# Patient Record
Sex: Female | Born: 1985 | Race: White | Hispanic: No | Marital: Married | Smoking: Never smoker
Health system: Southern US, Community
[De-identification: ages and names within clinical notes are randomized; demographics above are authoritative.]

## PROBLEM LIST (undated history)

## (undated) DIAGNOSIS — J45909 Unspecified asthma, uncomplicated: Secondary | ICD-10-CM

## (undated) DIAGNOSIS — I499 Cardiac arrhythmia, unspecified: Secondary | ICD-10-CM

## (undated) DIAGNOSIS — J302 Other seasonal allergic rhinitis: Secondary | ICD-10-CM

## (undated) DIAGNOSIS — K589 Irritable bowel syndrome without diarrhea: Secondary | ICD-10-CM

## (undated) DIAGNOSIS — R519 Headache, unspecified: Secondary | ICD-10-CM

## (undated) DIAGNOSIS — E282 Polycystic ovarian syndrome: Secondary | ICD-10-CM

## (undated) DIAGNOSIS — K9041 Non-celiac gluten sensitivity: Secondary | ICD-10-CM

## (undated) DIAGNOSIS — K219 Gastro-esophageal reflux disease without esophagitis: Secondary | ICD-10-CM

## (undated) DIAGNOSIS — Z9889 Other specified postprocedural states: Secondary | ICD-10-CM

## (undated) DIAGNOSIS — J020 Streptococcal pharyngitis: Secondary | ICD-10-CM

## (undated) DIAGNOSIS — R112 Nausea with vomiting, unspecified: Secondary | ICD-10-CM

## (undated) DIAGNOSIS — R51 Headache: Secondary | ICD-10-CM

## (undated) HISTORY — PX: WISDOM TOOTH EXTRACTION: SHX21

## (undated) HISTORY — PX: MOUTH SURGERY: SHX715

## (undated) HISTORY — DX: Polycystic ovarian syndrome: E28.2

---

## 2014-09-16 DIAGNOSIS — C541 Malignant neoplasm of endometrium: Secondary | ICD-10-CM

## 2014-09-16 HISTORY — DX: Malignant neoplasm of endometrium: C54.1

## 2015-08-03 ENCOUNTER — Other Ambulatory Visit: Payer: Self-pay | Admitting: Obstetrics

## 2015-08-03 ENCOUNTER — Encounter (HOSPITAL_COMMUNITY): Payer: Self-pay

## 2015-08-13 ENCOUNTER — Encounter (HOSPITAL_COMMUNITY): Payer: Self-pay | Admitting: Anesthesiology

## 2015-08-13 MED ORDER — DOXYCYCLINE HYCLATE 100 MG IV SOLR
100.0000 mg | Freq: Two times a day (BID) | INTRAVENOUS | Status: DC
  Filled 2015-08-13 (×4): qty 100

## 2015-08-13 MED ORDER — DOXYCYCLINE HYCLATE 100 MG IV SOLR
100.0000 mg | INTRAVENOUS | Status: AC
  Administered 2015-08-14: 100 mg via INTRAVENOUS
  Filled 2015-08-13: qty 100

## 2015-08-13 NOTE — Anesthesia Preprocedure Evaluation (Addendum)
Anesthesia Evaluation  Patient identified by MRN, date of birth, ID band Patient awake    Reviewed: Allergy & Precautions, NPO status , Patient's Chart, lab work & pertinent test results  History of Anesthesia Complications (+) PONV and history of anesthetic complications  Airway Mallampati: I       Dental no notable dental hx.    Pulmonary asthma ,    Pulmonary exam normal        Cardiovascular negative cardio ROS Normal cardiovascular exam+ dysrhythmias      Neuro/Psych  Headaches, negative psych ROS   GI/Hepatic GERD  Medicated and Controlled,IBS Gluten sensitive enteropathy   Endo/Other  PCOS  Renal/GU negative Renal ROS  negative genitourinary   Musculoskeletal negative musculoskeletal ROS (+)   Abdominal (+) + obese,   Peds  Hematology   Anesthesia Other Findings   Reproductive/Obstetrics Endometrial polyp                            Anesthesia Physical Anesthesia Plan  ASA: II  Anesthesia Plan: General   Post-op Pain Management:    Induction: Intravenous  Airway Management Planned: LMA  Additional Equipment:   Intra-op Plan:   Post-operative Plan:   Informed Consent: I have reviewed the patients History and Physical, chart, labs and discussed the procedure including the risks, benefits and alternatives for the proposed anesthesia with the patient or authorized representative who has indicated his/her understanding and acceptance.     Plan Discussed with: CRNA and Surgeon  Anesthesia Plan Comments:        Anesthesia Quick Evaluation

## 2015-08-14 ENCOUNTER — Ambulatory Visit (HOSPITAL_COMMUNITY): Payer: BLUE CROSS/BLUE SHIELD | Admitting: Anesthesiology

## 2015-08-14 ENCOUNTER — Encounter (HOSPITAL_COMMUNITY)
Admission: RE | Disposition: A | Discharge status: Home / Self Care | Payer: Self-pay | Source: Ambulatory Visit | Attending: Obstetrics

## 2015-08-14 ENCOUNTER — Encounter (HOSPITAL_COMMUNITY): Payer: Self-pay

## 2015-08-14 ENCOUNTER — Ambulatory Visit (HOSPITAL_COMMUNITY)
Admission: RE | Admit: 2015-08-14 | Discharge: 2015-08-14 | Disposition: A | Discharge status: Home / Self Care | Payer: BLUE CROSS/BLUE SHIELD | Source: Ambulatory Visit | Attending: Obstetrics | Admitting: Obstetrics

## 2015-08-14 DIAGNOSIS — I499 Cardiac arrhythmia, unspecified: Secondary | ICD-10-CM | POA: Insufficient documentation

## 2015-08-14 DIAGNOSIS — J45909 Unspecified asthma, uncomplicated: Secondary | ICD-10-CM | POA: Insufficient documentation

## 2015-08-14 DIAGNOSIS — K219 Gastro-esophageal reflux disease without esophagitis: Secondary | ICD-10-CM | POA: Insufficient documentation

## 2015-08-14 DIAGNOSIS — K589 Irritable bowel syndrome without diarrhea: Secondary | ICD-10-CM | POA: Insufficient documentation

## 2015-08-14 DIAGNOSIS — Z79899 Other long term (current) drug therapy: Secondary | ICD-10-CM | POA: Diagnosis not present

## 2015-08-14 DIAGNOSIS — N840 Polyp of corpus uteri: Secondary | ICD-10-CM | POA: Diagnosis present

## 2015-08-14 DIAGNOSIS — N8502 Endometrial intraepithelial neoplasia [EIN]: Secondary | ICD-10-CM | POA: Insufficient documentation

## 2015-08-14 HISTORY — PX: DILATATION & CURETTAGE/HYSTEROSCOPY WITH MYOSURE: SHX6511

## 2015-08-14 HISTORY — DX: Other seasonal allergic rhinitis: J30.2

## 2015-08-14 HISTORY — DX: Unspecified asthma, uncomplicated: J45.909

## 2015-08-14 HISTORY — DX: Non-celiac gluten sensitivity: K90.41

## 2015-08-14 HISTORY — DX: Headache, unspecified: R51.9

## 2015-08-14 HISTORY — DX: Irritable bowel syndrome, unspecified: K58.9

## 2015-08-14 HISTORY — DX: Polycystic ovarian syndrome: E28.2

## 2015-08-14 HISTORY — DX: Cardiac arrhythmia, unspecified: I49.9

## 2015-08-14 HISTORY — DX: Headache: R51

## 2015-08-14 HISTORY — DX: Other specified postprocedural states: Z98.890

## 2015-08-14 HISTORY — DX: Gastro-esophageal reflux disease without esophagitis: K21.9

## 2015-08-14 HISTORY — DX: Nausea with vomiting, unspecified: R11.2

## 2015-08-14 HISTORY — DX: Streptococcal pharyngitis: J02.0

## 2015-08-14 LAB — CBC
HEMATOCRIT: 43.1 % (ref 36.0–46.0)
Hemoglobin: 14.7 g/dL (ref 12.0–15.0)
MCH: 32.2 pg (ref 26.0–34.0)
MCHC: 34.1 g/dL (ref 30.0–36.0)
MCV: 94.5 fL (ref 78.0–100.0)
PLATELETS: 380 10*3/uL (ref 150–400)
RBC: 4.56 MIL/uL (ref 3.87–5.11)
RDW: 12.3 % (ref 11.5–15.5)
WBC: 8.2 10*3/uL (ref 4.0–10.5)

## 2015-08-14 LAB — PREGNANCY, URINE: Preg Test, Ur: NEGATIVE

## 2015-08-14 LAB — TYPE AND SCREEN
ABO/RH(D): A POS
Antibody Screen: NEGATIVE

## 2015-08-14 LAB — ABO/RH: ABO/RH(D): A POS

## 2015-08-14 SURGERY — DILATATION & CURETTAGE/HYSTEROSCOPY WITH MYOSURE
Anesthesia: General | Site: Vagina

## 2015-08-14 MED ORDER — PROPOFOL 10 MG/ML IV BOLUS
INTRAVENOUS | Status: AC
  Filled 2015-08-14: qty 20

## 2015-08-14 MED ORDER — IBUPROFEN 600 MG PO TABS: 600.0000 mg | ORAL_TABLET | Freq: Four times a day (QID) | ORAL | Status: DC | PRN

## 2015-08-14 MED ORDER — MEPERIDINE HCL 25 MG/ML IJ SOLN: 6.2500 mg | INTRAMUSCULAR | Status: DC | PRN

## 2015-08-14 MED ORDER — FENTANYL CITRATE (PF) 100 MCG/2ML IJ SOLN
INTRAMUSCULAR | Status: DC | PRN
  Administered 2015-08-14 (×4): 50 ug via INTRAVENOUS

## 2015-08-14 MED ORDER — ONDANSETRON HCL 4 MG/2ML IJ SOLN
INTRAMUSCULAR | Status: AC
  Filled 2015-08-14: qty 2

## 2015-08-14 MED ORDER — CHLOROPROCAINE HCL 1 % IJ SOLN
INTRAMUSCULAR | Status: AC
Start: 2015-08-14 — End: 2015-08-14
  Filled 2015-08-14: qty 30

## 2015-08-14 MED ORDER — MIDAZOLAM HCL 2 MG/2ML IJ SOLN
INTRAMUSCULAR | Status: AC
  Filled 2015-08-14: qty 2

## 2015-08-14 MED ORDER — PROPOFOL 10 MG/ML IV BOLUS
INTRAVENOUS | Status: DC | PRN
  Administered 2015-08-14: 200 mg via INTRAVENOUS

## 2015-08-14 MED ORDER — DEXAMETHASONE SODIUM PHOSPHATE 10 MG/ML IJ SOLN
INTRAMUSCULAR | Status: AC
  Filled 2015-08-14: qty 1

## 2015-08-14 MED ORDER — LIDOCAINE HCL (CARDIAC) 20 MG/ML IV SOLN
INTRAVENOUS | Status: AC
  Filled 2015-08-14: qty 5

## 2015-08-14 MED ORDER — BUPIVACAINE HCL 0.5 % IJ SOLN
INTRAMUSCULAR | Status: DC | PRN
  Administered 2015-08-14: 30 mL

## 2015-08-14 MED ORDER — ONDANSETRON HCL 4 MG/2ML IJ SOLN
INTRAMUSCULAR | Status: DC | PRN
  Administered 2015-08-14: 4 mg via INTRAVENOUS

## 2015-08-14 MED ORDER — SCOPOLAMINE 1 MG/3DAYS TD PT72
MEDICATED_PATCH | TRANSDERMAL | Status: AC
  Filled 2015-08-14: qty 1

## 2015-08-14 MED ORDER — SODIUM CHLORIDE 0.9 % IR SOLN
Status: DC | PRN
  Administered 2015-08-14: 3000 mL

## 2015-08-14 MED ORDER — OXYCODONE-ACETAMINOPHEN 5-325 MG PO TABS: 2.0000 | ORAL_TABLET | Freq: Four times a day (QID) | ORAL | Status: DC | PRN

## 2015-08-14 MED ORDER — HYDROCODONE-ACETAMINOPHEN 7.5-325 MG PO TABS: 1.0000 | ORAL_TABLET | Freq: Once | ORAL | Status: DC | PRN

## 2015-08-14 MED ORDER — LIDOCAINE HCL (CARDIAC) 20 MG/ML IV SOLN
INTRAVENOUS | Status: DC | PRN
  Administered 2015-08-14: 50 mg via INTRAVENOUS

## 2015-08-14 MED ORDER — FENTANYL CITRATE (PF) 100 MCG/2ML IJ SOLN
INTRAMUSCULAR | Status: AC
  Filled 2015-08-14: qty 2

## 2015-08-14 MED ORDER — BUPIVACAINE HCL (PF) 0.5 % IJ SOLN
INTRAMUSCULAR | Status: AC
  Filled 2015-08-14: qty 30

## 2015-08-14 MED ORDER — MIDAZOLAM HCL 5 MG/5ML IJ SOLN
INTRAMUSCULAR | Status: DC | PRN
  Administered 2015-08-14: 2 mg via INTRAVENOUS

## 2015-08-14 MED ORDER — FENTANYL CITRATE (PF) 100 MCG/2ML IJ SOLN
25.0000 ug | INTRAMUSCULAR | Status: DC | PRN
  Administered 2015-08-14: 25 ug via INTRAVENOUS

## 2015-08-14 MED ORDER — KETOROLAC TROMETHAMINE 30 MG/ML IJ SOLN
INTRAMUSCULAR | Status: DC | PRN
  Administered 2015-08-14: 30 mg via INTRAVENOUS

## 2015-08-14 MED ORDER — DEXAMETHASONE SODIUM PHOSPHATE 4 MG/ML IJ SOLN
INTRAMUSCULAR | Status: DC | PRN
  Administered 2015-08-14: 4 mg via INTRAVENOUS

## 2015-08-14 MED ORDER — SCOPOLAMINE 1 MG/3DAYS TD PT72
1.0000 | MEDICATED_PATCH | Freq: Once | TRANSDERMAL | Status: DC
  Administered 2015-08-14: 1.5 mg via TRANSDERMAL

## 2015-08-14 MED ORDER — METOCLOPRAMIDE HCL 5 MG/ML IJ SOLN: 10.0000 mg | Freq: Once | INTRAMUSCULAR | Status: DC | PRN

## 2015-08-14 MED ORDER — KETOROLAC TROMETHAMINE 30 MG/ML IJ SOLN
INTRAMUSCULAR | Status: AC
  Filled 2015-08-14: qty 1

## 2015-08-14 MED ORDER — LACTATED RINGERS IV SOLN
INTRAVENOUS | Status: DC
  Administered 2015-08-14 (×2): via INTRAVENOUS

## 2015-08-14 MED ORDER — DEXAMETHASONE SODIUM PHOSPHATE 4 MG/ML IJ SOLN
INTRAMUSCULAR | Status: AC
  Filled 2015-08-14: qty 1

## 2015-08-14 MED ORDER — VASOPRESSIN 20 UNIT/ML IV SOLN
INTRAVENOUS | Status: AC
  Filled 2015-08-14: qty 1

## 2015-08-14 SURGICAL SUPPLY — 18 items
CATH ROBINSON RED A/P 16FR (CATHETERS) ×2 IMPLANT
CLOTH BEACON ORANGE TIMEOUT ST (SAFETY) ×2 IMPLANT
CONTAINER PREFILL 10% NBF 60ML (FORM) ×4 IMPLANT
DEVICE MYOSURE LITE (MISCELLANEOUS) ×2 IMPLANT
DEVICE MYOSURE REACH (MISCELLANEOUS) IMPLANT
FILTER ARTHROSCOPY CONVERTOR (FILTER) ×2 IMPLANT
GLOVE BIO SURGEON STRL SZ 6.5 (GLOVE) ×4 IMPLANT
GLOVE BIOGEL PI IND STRL 7.0 (GLOVE) ×2 IMPLANT
GLOVE BIOGEL PI INDICATOR 7.0 (GLOVE) ×2
GOWN STRL REUS W/TWL LRG LVL3 (GOWN DISPOSABLE) ×4 IMPLANT
PACK VAGINAL MINOR WOMEN LF (CUSTOM PROCEDURE TRAY) ×2 IMPLANT
PAD OB MATERNITY 4.3X12.25 (PERSONAL CARE ITEMS) ×2 IMPLANT
SEAL ROD LENS SCOPE MYOSURE (ABLATOR) ×2 IMPLANT
STENT BALLN UTERINE 4CM 6FR (STENTS) IMPLANT
TOWEL OR 17X24 6PK STRL BLUE (TOWEL DISPOSABLE) ×4 IMPLANT
TUBING AQUILEX INFLOW (TUBING) ×4 IMPLANT
TUBING AQUILEX OUTFLOW (TUBING) ×2 IMPLANT
WATER STERILE IRR 1000ML POUR (IV SOLUTION) ×2 IMPLANT

## 2015-08-14 NOTE — Op Note (Signed)
08/14/2015  12:55 PM  PATIENT:  Sherry [name]  29 y.o. female  PRE-OPERATIVE DIAGNOSIS:  Endometrial Polyp, complex hyperplasia, menorrhagia  [name]-OPERATIVE DIAGNOSIS:  Endometrial Polyp,  complex hyperplasia, menorrhagia  PROCEDURE:  Procedure(s): DILATATION & CURETTAGE/HYSTEROSCOPY WITH MYOSURE/Polypectomy (N/A)  SURGEON:  Surgeon(s) and Role:    * Aloha Gell, MD - Primary  PHYSICIAN ASSISTANT:   ASSISTANTS: none   ANESTHESIA:   local and general  EBL:  Total I/O In: 1000 [I.V.:1000] Out: 100 [Urine:100]  BLOOD ADMINISTERED:none  DRAINS: none   LOCAL MEDICATIONS USED:  MARCAINE    and Amount: 30 ml, 0.5 cc  SPECIMEN:  Source of Specimen:  endometrial currettings  DISPOSITION OF SPECIMEN:  PATHOLOGY  COUNTS:  YES  TOURNIQUET:  * No tourniquets in log *  DICTATION: .Note written in EPIC  PLAN OF CARE: Discharge to home after PACU  PATIENT DISPOSITION:  PACU - hemodynamically stable.   Delay start of Pharmacological VTE agent (>24hrs) due to surgical blood loss or risk of bleeding: yes   Abx: 100mg  IV doxycycline  Findings: non- visualization of bilateral ostia, globally thickened and vascular endometrium, single discreet polyp. hemostasis [name]-procedure  Indications: complex endometrial hyperplasia, menorrhagia, thickened endometrial stripe, endometrial polyp   After informed consent including discussion of risks of bleeding, infection, perforation,  the patient was taken to the operating room where general anesthesia was initiated without difficulty. She was prepped and draped in normal sterile fashion in the dorsal supine lithotomy position.  A bimanual examination was done to assess the size and position of the uterus. A speculum was placed in the vagina and single tooth tenaculum used to grasp the anterior lip of the cervix. Local anaesthetic was injected at 5 and 7 o'clock in the cervico-paracervical junction.   The cervix was then serially dilated  to a #19 Pratt dilator. The Myosure device was placed into the cervix and past the internal os.  Findins as above. Due to the globally thickened endometrium, neither ostia was seen. Attempt to increase pressure of hysteroscopic fluid was unsuccessful in visualization of the ostia. Myosure device was employed for suction and morcellation to remove the single polyp and the thicker polypoid tissue. Limited morcellation was done in the cornual region due to impaired visualization. A sharp curretage was hen carried out.  Hemostasis was noted. Instruments were removed.   The hysteroscope was then removed. Tenaculum was removed. The tenaculum site was hemostatic and the case was terminated. The patient tolerated the procedure well. Sponge, lap and needle counts were correct and the patient was taken to the recovery room in stable condition.   [name] A. 08/14/2015 12:57 PM

## 2015-08-14 NOTE — H&P (Signed)
CC;D&C, hysteroscopy, polypectomy  HPI: 29 yo G0, NP pt with h/o PCOS and alternating oligomenorrhea with menorrhagia who was found to have complex hyperplasia in a polyp on ebx 10.16. SIS showed thick endometrium and several polyps. Nl pap 5/16.  PMH: PCOS All: NKDA Meds: Prilosec, Claritin  Past Medical History  Diagnosis Date  . Asthma   . Strep throat   . Headache     Migraines  . Dysrhythmia     tachycardia  . Seasonal allergies   . PONV (postoperative nausea and vomiting)   . GERD (gastroesophageal reflux disease)   . IBS (irritable bowel syndrome)   . Gluten-sensitive enteropathy   . PCOS (polycystic ovarian syndrome)     Past Surgical History  Procedure Laterality Date  . Wisdom tooth extraction    . Mouth surgery      PE: Filed Vitals:   08/14/15 0855  BP: 129/93  Pulse: 89  Temp: 97.9 F (36.6 C)  Resp: 20   Gen: well appearing, no distress Abd: soft, NT, obese Gu: def to OR LE: NT, no edema  CBC    Component Value Date/Time   WBC 8.2 08/14/2015 0850   RBC 4.56 08/14/2015 0850   HGB 14.7 08/14/2015 0850   HCT 43.1 08/14/2015 0850   PLT 380 08/14/2015 0850   MCV 94.5 08/14/2015 0850   MCH 32.2 08/14/2015 0850   MCHC 34.1 08/14/2015 0850   RDW 12.3 08/14/2015 0850    A/P: complex hyperplasia, endometrial polyps  Plan D&C, hysteroscopy, polypectomy. R/B d/w pt.   Sherry Bowdoin A. 08/14/2015 11:36 AM

## 2015-08-14 NOTE — Transfer of Care (Signed)
Immediate Anesthesia Transfer of Care Note  Patient: Sherry [name]  Procedure(s) Performed: Procedure(s): DILATATION & CURETTAGE/HYSTEROSCOPY WITH MYOSURE/Polypectomy (N/A)  Patient Location: PACU  Anesthesia Type:General  Level of Consciousness: awake, alert  and oriented  Airway & Oxygen Therapy: Patient Spontanous Breathing and Patient connected to nasal cannula oxygen  [name]-op Assessment: Report given to RN  [name] vital signs: Reviewed  Last Vitals:  Filed Vitals:   08/14/15 0855  BP: 129/93  Pulse: 89  Temp: 36.6 C  Resp: 20    Complications: No apparent anesthesia complications

## 2015-08-14 NOTE — Brief Op Note (Signed)
08/14/2015  12:55 PM  PATIENT:  Sherry [name]  29 y.o. female  PRE-OPERATIVE DIAGNOSIS:  Endometrial Polyp, complex hyperplasia, menorrhagia  [name]-OPERATIVE DIAGNOSIS:  Endometrial Polyp,  complex hyperplasia, menorrhagia  PROCEDURE:  Procedure(s): DILATATION & CURETTAGE/HYSTEROSCOPY WITH MYOSURE/Polypectomy (N/A)  SURGEON:  Surgeon(s) and Role:    * Aloha Gell, MD - Primary  PHYSICIAN ASSISTANT:   ASSISTANTS: none   ANESTHESIA:   local and general  EBL:  Total I/O In: 1000 [I.V.:1000] Out: 100 [Urine:100]  BLOOD ADMINISTERED:none  DRAINS: none   LOCAL MEDICATIONS USED:  MARCAINE    and Amount: 30 ml, 0.5 cc  SPECIMEN:  Source of Specimen:  endometrial currettings  DISPOSITION OF SPECIMEN:  PATHOLOGY  COUNTS:  YES  TOURNIQUET:  * No tourniquets in log *  DICTATION: .Note written in EPIC  PLAN OF CARE: Discharge to home after PACU  PATIENT DISPOSITION:  PACU - hemodynamically stable.   Delay start of Pharmacological VTE agent (>24hrs) due to surgical blood loss or risk of bleeding: yes

## 2015-08-14 NOTE — Anesthesia Procedure Notes (Signed)
Procedure Name: LMA Insertion Date/Time: 08/14/2015 11:45 AM Performed by: Talbot Grumbling Pre-anesthesia Checklist: Patient identified, Emergency Drugs available, Suction available and Patient being monitored Patient Re-evaluated:Patient Re-evaluated prior to inductionOxygen Delivery Method: Circle system utilized Preoxygenation: Pre-oxygenation with 100% oxygen Intubation Type: IV induction Ventilation: Mask ventilation without difficulty LMA: LMA inserted LMA Size: 4.0 Number of attempts: 1 Placement Confirmation: positive ETCO2 and breath sounds checked- equal and bilateral Tube secured with: Tape Dental Injury: Teeth and Oropharynx as per pre-operative assessment

## 2015-08-14 NOTE — Anesthesia Postprocedure Evaluation (Signed)
Anesthesia [name] Note  Patient: Sherry [name]  Procedure(s) Performed: Procedure(s) (LRB): DILATATION & CURETTAGE/HYSTEROSCOPY WITH MYOSURE/Polypectomy (N/A)  Patient location during evaluation: PACU Anesthesia Type: General Level of consciousness: awake and alert and oriented Pain management: pain level controlled Vital Signs Assessment: [name]-procedure vital signs reviewed and stable Respiratory status: spontaneous breathing, nonlabored ventilation, respiratory function stable and patient connected to nasal cannula oxygen Cardiovascular status: blood pressure returned to baseline and stable Postop Assessment: No signs of nausea or vomiting Anesthetic complications: no    Last Vitals:  Filed Vitals:   08/14/15 1300 08/14/15 1315  BP: 127/87 122/88  Pulse: 89 88  Temp:    Resp: 21 15    Last Pain:  Filed Vitals:   08/14/15 1319  PainSc: 0-No pain                 Gaylynn Seiple A.

## 2015-08-14 NOTE — Discharge Instructions (Signed)

## 2015-08-15 ENCOUNTER — Encounter (HOSPITAL_COMMUNITY): Payer: Self-pay | Admitting: Obstetrics

## 2015-09-17 DIAGNOSIS — J020 Streptococcal pharyngitis: Secondary | ICD-10-CM

## 2015-09-17 HISTORY — DX: Streptococcal pharyngitis: J02.0

## 2015-10-23 ENCOUNTER — Encounter: Payer: Self-pay | Admitting: Gynecologic Oncology

## 2015-10-23 ENCOUNTER — Ambulatory Visit: Payer: BLUE CROSS/BLUE SHIELD | Attending: Gynecologic Oncology | Admitting: Gynecologic Oncology

## 2015-10-23 VITALS — BP 136/88 | HR 85 | Temp 97.8°F | Resp 18 | Ht 62.0 in | Wt 177.9 lb

## 2015-10-23 DIAGNOSIS — K219 Gastro-esophageal reflux disease without esophagitis: Secondary | ICD-10-CM | POA: Diagnosis not present

## 2015-10-23 DIAGNOSIS — J45909 Unspecified asthma, uncomplicated: Secondary | ICD-10-CM | POA: Insufficient documentation

## 2015-10-23 DIAGNOSIS — K589 Irritable bowel syndrome without diarrhea: Secondary | ICD-10-CM | POA: Insufficient documentation

## 2015-10-23 DIAGNOSIS — E282 Polycystic ovarian syndrome: Secondary | ICD-10-CM | POA: Insufficient documentation

## 2015-10-23 DIAGNOSIS — N970 Female infertility associated with anovulation: Secondary | ICD-10-CM | POA: Insufficient documentation

## 2015-10-23 DIAGNOSIS — Z975 Presence of (intrauterine) contraceptive device: Secondary | ICD-10-CM | POA: Diagnosis not present

## 2015-10-23 DIAGNOSIS — C541 Malignant neoplasm of endometrium: Secondary | ICD-10-CM | POA: Diagnosis not present

## 2015-10-23 DIAGNOSIS — Z79899 Other long term (current) drug therapy: Secondary | ICD-10-CM | POA: Diagnosis not present

## 2015-10-23 NOTE — Progress Notes (Signed)
Follow-up Note: Gyn-Onc  Consult was initially requested by Dr. Valentino Saxon for the evaluation of Sherry [name] 30 y.o. female  CC:  Chief Complaint  Patient presents with  . endometrial cancer    follow up from [facility]     Assessment/Plan:  Ms. Sherry [name]  is a 30 y.o.  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 S/p IUD placement in December 2016.  Will follow-up biopsy result today.  Will repeat biopsy at 3 months, and again in 6 months, but if not regressed by that point, continued medical therapy likely to be futile and would recommend consideration for definitive hysterectomy.   HPI: Sherry [name] is a 30 year old G0 who was seen by my partner, Dr Andrew Au, in consultation at the request of Dr Valentino Saxon in December, 2016 for grade 1 endometrial cancer on biopsy.  She has a history of primary infertility and anovulation, but denies heavy vaginal bleeding.  She desires future fertility.  Interval History: In December 2016 she underwent placement of progestin releasing IUD. Since placement she has had light vaginal bleeding daily.  Current Meds:  Outpatient Encounter Prescriptions as of 10/23/2015  Medication Sig  . ibuprofen (ADVIL,MOTRIN) 600 MG tablet Take 1 tablet (600 mg total) by mouth every 6 (six) hours as needed.  . valACYclovir (VALTREX) 500 MG tablet Take 500 mg by mouth 2 (two) times daily.  Marland Kitchen loratadine (CLARITIN) 10 MG tablet Take 10 mg by mouth daily. Reported on 10/23/2015  . omeprazole (PRILOSEC) 20 MG capsule Take 20 mg by mouth as needed (heart burn). Reported on 10/23/2015  . [DISCONTINUED] oxyCODONE-acetaminophen (ROXICET) 5-325 MG tablet Take 2 tablets by mouth every 6 (six) hours as needed for moderate pain or severe pain.   No facility-administered encounter medications on file as of 10/23/2015.    Allergy: No Known Allergies  Social Hx:   Social History   Social History  .  Marital Status: Single    Spouse Name: N/A  . Number of Children: N/A  . Years of Education: N/A   Occupational History  . Not on file.   Social History Main Topics  . Smoking status: Never Smoker   . Smokeless tobacco: Never Used  . Alcohol Use: Yes  . Drug Use: No  . Sexual Activity: Yes    Birth Control/ Protection: Condom   Other Topics Concern  . Not on file   Social History Narrative    Past Surgical Hx:  Past Surgical History  Procedure Laterality Date  . Wisdom tooth extraction    . Mouth surgery    . Dilatation & curettage/hysteroscopy with myosure N/A 08/14/2015    Procedure: DILATATION & CURETTAGE/HYSTEROSCOPY WITH MYOSURE/Polypectomy;  Surgeon: Aloha Gell, MD;  Location: [facility];  Service: Gynecology;  Laterality: N/A;    Past Medical Hx:  Past Medical History  Diagnosis Date  . Asthma   . Strep throat   . Headache     Migraines  . Dysrhythmia     tachycardia  . Seasonal allergies   . PONV (postoperative nausea and vomiting)   . GERD (gastroesophageal reflux disease)   . IBS (irritable bowel syndrome)   . Gluten-sensitive enteropathy   . PCOS (polycystic ovarian syndrome)     Past Gynecological History: G0, annovulation  No LMP recorded.  Family Hx: History reviewed. No pertinent family history.  Review of Systems:  Constitutional  Feels well,    ENT Normal appearing ears and nares bilaterally  Skin/Breast  No rash, sores, jaundice, itching, dryness Cardiovascular  No chest pain, shortness of breath, or edema  Pulmonary  No cough or wheeze.  Gastro Intestinal  No nausea, vomitting, or diarrhoea. No bright red blood per rectum, no abdominal pain, change in bowel movement, or constipation.  Genito Urinary  No frequency, urgency, dysuria, + vaginal spotting daily Musculo Skeletal  No myalgia, arthralgia, joint swelling or pain  Neurologic  No weakness, numbness, change in gait,  Psychology  No depression, anxiety, insomnia.    Vitals:  Blood pressure 136/88, pulse 85, temperature 97.8 F (36.6 C), temperature source Oral, resp. rate 18, height 5\' 2"  (1.575 m), weight 177 lb 14.4 oz (80.695 kg), SpO2 100 %.  Physical Exam: WD in NAD Neck  Supple NROM, without any enlargements.  Lymph Node Survey No cervical supraclavicular or inguinal adenopathy Abdomen  Normoactive bowel sounds, abdomen soft, non-tender and obese without evidence of hernia.  Back No CVA tenderness Genito Urinary  Vulva/vagina: Normal external female genitalia.  No lesions. No discharge or bleeding.  Bladder/urethra:  No lesions or masses, well supported bladder  Vagina: grossly normal  Cervix: Normal appearing, no lesions. IUD strings in place  Uterus:  Small, mobile, no parametrial involvement or nodularity.  Adnexa: no palpable masses. Rectal  deferred Extremities  No bilateral cyanosis, clubbing or edema.  PROCEDURE NOTE:  Endometrial Biopsy  The procedure was explained.  The speculum was placed and the cervix was visualized with IUD strings.  The endometrial biopsy was performed with 2 passes of the endometrial biopsy pipelle. Moderate tissue was obtained. The uterus sounded to 7 cm.  The patient tolerated procedure     Sherry Eva, MD  10/23/2015, 3:15 PM

## 2015-10-23 NOTE — Patient Instructions (Signed)
We will call you with the results of your endometrial biopsy from today.  Plan to follow up in three months or sooner if needed.  Endometrial Biopsy, Care After Refer to this sheet in the next few weeks. These instructions provide you with information on caring for yourself after your procedure. Your health care provider may also give you more specific instructions. Your treatment has been planned according to current medical practices, but problems sometimes occur. Call your health care provider if you have any problems or questions after your procedure. WHAT TO EXPECT AFTER THE PROCEDURE After your procedure, it is typical to have the following:  You may have mild cramping and a small amount of vaginal bleeding for a few days after the procedure. This is normal. HOME CARE INSTRUCTIONS  Only take over-the-counter or prescription medicine as directed by your health care provider.  Do not douche, use tampons, or have sexual intercourse until your health care provider approves.  Follow your health care provider's instructions regarding any activity restrictions, such as strenuous exercise or heavy lifting. SEEK MEDICAL CARE IF:  You have heavy bleeding or bleeding longer than 2 days after the procedure.  You have bad smelling drainage from your vagina.  You have a fever and chills.  Youhave severe lower stomach (abdominal) pain. SEEK IMMEDIATE MEDICAL CARE IF:  You have severe cramps in your stomach or back.  You pass large blood clots.  Your bleeding increases.  You become weak or lightheaded, or you pass out.   This information is not intended to replace advice given to you by your health care provider. Make sure you discuss any questions you have with your health care provider.   Document Released: 06/23/2013 Document Reviewed: 06/23/2013 Elsevier Interactive Patient Education [employer].

## 2015-10-27 ENCOUNTER — Telehealth: Payer: Self-pay

## 2015-10-27 NOTE — Telephone Encounter (Signed)
Orders received from [facility] to contact the patient and update that her biopsy shows that it appears her "cancer" is responding to the progestin, we will need to resample in 3 months . Patient contacted and updated , patient states understanding , patient requesting to reschedule her follow up appointment from May 15 , 2017 to Jan 19, 2016 . Patient denies further questions at this time , will call with any changes.

## 2016-01-01 DIAGNOSIS — T148 Other injury of unspecified body region: Secondary | ICD-10-CM | POA: Diagnosis not present

## 2016-01-17 DIAGNOSIS — R300 Dysuria: Secondary | ICD-10-CM | POA: Diagnosis not present

## 2016-01-17 DIAGNOSIS — N3090 Cystitis, unspecified without hematuria: Secondary | ICD-10-CM | POA: Diagnosis not present

## 2016-01-19 ENCOUNTER — Encounter: Payer: Self-pay | Admitting: Gynecologic Oncology

## 2016-01-19 ENCOUNTER — Ambulatory Visit: Payer: BLUE CROSS/BLUE SHIELD | Attending: Gynecologic Oncology | Admitting: Gynecologic Oncology

## 2016-01-19 VITALS — BP 124/87 | HR 82 | Temp 98.1°F | Resp 18 | Ht 62.0 in | Wt 181.2 lb

## 2016-01-19 DIAGNOSIS — Z975 Presence of (intrauterine) contraceptive device: Secondary | ICD-10-CM | POA: Insufficient documentation

## 2016-01-19 DIAGNOSIS — K589 Irritable bowel syndrome without diarrhea: Secondary | ICD-10-CM | POA: Diagnosis not present

## 2016-01-19 DIAGNOSIS — N970 Female infertility associated with anovulation: Secondary | ICD-10-CM | POA: Diagnosis not present

## 2016-01-19 DIAGNOSIS — J45909 Unspecified asthma, uncomplicated: Secondary | ICD-10-CM | POA: Diagnosis not present

## 2016-01-19 DIAGNOSIS — C541 Malignant neoplasm of endometrium: Secondary | ICD-10-CM | POA: Diagnosis not present

## 2016-01-19 DIAGNOSIS — K219 Gastro-esophageal reflux disease without esophagitis: Secondary | ICD-10-CM | POA: Insufficient documentation

## 2016-01-19 DIAGNOSIS — E282 Polycystic ovarian syndrome: Secondary | ICD-10-CM | POA: Diagnosis not present

## 2016-01-19 DIAGNOSIS — R000 Tachycardia, unspecified: Secondary | ICD-10-CM | POA: Insufficient documentation

## 2016-01-19 NOTE — Progress Notes (Signed)
Follow-up Note: Gyn-Onc  Consult was initially requested by Dr. Valentino Saxon for the evaluation of Sherry [name] 30 y.o. female  CC:  Chief Complaint  Patient presents with  . endometrial cancer    follow up    Assessment/Plan:  Sherry [name]  is a 30 y.o.  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 S/p IUD placement in December 2016.  Improvement to [facility] on biopsy in February 2017.  Will follow-up biopsy result today.  Will repeat biopsy at 6 months. Patient has no plans for fertility in the next 1-2 years, therefore, provided no progression to malignancy is identified, will continue IUD during that time.   HPI: Sherry [name] is a 30 year old G0 who was seen by my partner, Dr Andrew Au, in consultation at the request of Dr Valentino Saxon in December, 2016 for grade 1 endometrial cancer on biopsy.  She has a history of primary infertility and anovulation, but denies heavy vaginal bleeding.  She desires future fertility.  In December 2016 she underwent placement of progestin releasing IUD. Since placement she has had light vaginal bleeding daily. Biopsy of the endometrium in February 2017 showed focal glandular atypia (inactive endometrium with progestin effect, but a few small foci with nuclear atypia suspicious for complex atypical hyperplasia).  Interval History: She has had no bleeding or menses since her last biopsy.  Current Meds:  Outpatient Encounter Prescriptions as of 01/19/2016  Medication Sig  . ibuprofen (ADVIL,MOTRIN) 600 MG tablet Take 1 tablet (600 mg total) by mouth every 6 (six) hours as needed.  . loratadine (CLARITIN) 10 MG tablet Take 10 mg by mouth daily. Reported on 10/23/2015  . nitrofurantoin, macrocrystal-monohydrate, (MACROBID) 100 MG capsule Take 100 mg by mouth 2 (two) times daily.  Marland Kitchen omeprazole (PRILOSEC) 20 MG capsule Take 20 mg by mouth as needed (heart burn). Reported on  01/19/2016  . valACYclovir (VALTREX) 500 MG tablet Take 500 mg by mouth 2 (two) times daily. Reported on 01/19/2016   No facility-administered encounter medications on file as of 01/19/2016.    Allergy: No Known Allergies  Social Hx:   Social History   Social History  . Marital Status: Single    Spouse Name: N/A  . Number of Children: N/A  . Years of Education: N/A   Occupational History  . Not on file.   Social History Main Topics  . Smoking status: Never Smoker   . Smokeless tobacco: Never Used  . Alcohol Use: Yes  . Drug Use: No  . Sexual Activity: Yes    Birth Control/ Protection: Condom   Other Topics Concern  . Not on file   Social History Narrative    Past Surgical Hx:  Past Surgical History  Procedure Laterality Date  . Wisdom tooth extraction    . Mouth surgery    . Dilatation & curettage/hysteroscopy with myosure N/A 08/14/2015    Procedure: DILATATION & CURETTAGE/HYSTEROSCOPY WITH MYOSURE/Polypectomy;  Surgeon: Aloha Gell, MD;  Location: [facility];  Service: Gynecology;  Laterality: N/A;    Past Medical Hx:  Past Medical History  Diagnosis Date  . Asthma   . Strep throat   . Headache     Migraines  . Dysrhythmia     tachycardia  . Seasonal allergies   . PONV (postoperative nausea and vomiting)   . GERD (gastroesophageal reflux disease)   . IBS (irritable bowel syndrome)   . Gluten-sensitive enteropathy   . PCOS (polycystic ovarian syndrome)  Past Gynecological History: G0, annovulation  No LMP recorded.  Family Hx: History reviewed. No pertinent family history.  Review of Systems:  Constitutional  Feels well,    ENT Normal appearing ears and nares bilaterally Skin/Breast  No rash, sores, jaundice, itching, dryness Cardiovascular  No chest pain, shortness of breath, or edema  Pulmonary  No cough or wheeze.  Gastro Intestinal  No nausea, vomitting, or diarrhoea. No bright red blood per rectum, no abdominal pain, change in bowel  movement, or constipation.  Genito Urinary  No frequency, urgency, dysuria, no vaginal spotting Musculo Skeletal  No myalgia, arthralgia, joint swelling or pain  Neurologic  No weakness, numbness, change in gait,  Psychology  No depression, anxiety, insomnia.   Vitals:  Blood pressure 124/87, pulse 82, temperature 98.1 F (36.7 C), temperature source Oral, resp. rate 18, height 5\' 2"  (1.575 m), weight 181 lb 3.2 oz (82.192 kg), SpO2 99 %.  Physical Exam: WD in NAD Neck  Supple NROM, without any enlargements.  Lymph Node Survey No cervical supraclavicular or inguinal adenopathy Abdomen  Normoactive bowel sounds, abdomen soft, non-tender and obese without evidence of hernia.  Back No CVA tenderness Genito Urinary  Vulva/vagina: Normal external female genitalia.  No lesions. No discharge or bleeding.  Bladder/urethra:  No lesions or masses, well supported bladder  Vagina: grossly normal  Cervix: Normal appearing, no lesions. IUD strings in place  Uterus:  Small, mobile, no parametrial involvement or nodularity.  Adnexa: no palpable masses. Rectal  deferred Extremities  No bilateral cyanosis, clubbing or edema.  PROCEDURE NOTE:  Endometrial Biopsy  The procedure was explained.  The speculum was placed and the cervix was visualized with IUD strings.  The endometrial biopsy was performed with 1 pass of the endometrial biopsy pipelle. Moderate tissue was obtained. The uterus sounded to 7 cm.  The patient tolerated procedure. Specimen sent for surgical pathology.    Donaciano Eva, MD  01/19/2016, 2:19 PM  CC: Dr Andrew Au, Dr Aloha Gell

## 2016-01-19 NOTE — Patient Instructions (Signed)
We will call you with the results of your endometrial biopsy from today.  Plan to follow up in Dec or sooner if needed.  Endometrial Biopsy, Care After Refer to this sheet in the next few weeks. These instructions provide you with information on caring for yourself after your procedure. Your health care provider may also give you more specific instructions. Your treatment has been planned according to current medical practices, but problems sometimes occur. Call your health care provider if you have any problems or questions after your procedure. WHAT TO EXPECT AFTER THE PROCEDURE After your procedure, it is typical to have the following:  You may have mild cramping and a small amount of vaginal bleeding for a few days after the procedure. This is normal. HOME CARE INSTRUCTIONS  Only take over-the-counter or prescription medicine as directed by your health care provider.  Do not douche, use tampons, or have sexual intercourse until your health care provider approves.  Follow your health care provider's instructions regarding any activity restrictions, such as strenuous exercise or heavy lifting. SEEK MEDICAL CARE IF:  You have heavy bleeding or bleeding longer than 2 days after the procedure.  You have bad smelling drainage from your vagina.  You have a fever and chills.  Youhave severe lower stomach (abdominal) pain. SEEK IMMEDIATE MEDICAL CARE IF:  You have severe cramps in your stomach or back.  You pass large blood clots.  Your bleeding increases.  You become weak or lightheaded, or you pass out.   This information is not intended to replace advice given to you by your health care provider. Make sure you discuss any questions you have with your health care provider.   Document Released: 06/23/2013 Document Reviewed: 06/23/2013 Elsevier Interactive Patient Education [employer].

## 2016-01-23 ENCOUNTER — Telehealth: Payer: Self-pay | Admitting: Gynecologic Oncology

## 2016-01-23 NOTE — Telephone Encounter (Signed)
Left message about biopsy results.  Advised to call for any questions or concerns.

## 2016-01-29 ENCOUNTER — Ambulatory Visit: Payer: BLUE CROSS/BLUE SHIELD | Admitting: Gynecologic Oncology

## 2016-02-02 DIAGNOSIS — R300 Dysuria: Secondary | ICD-10-CM | POA: Diagnosis not present

## 2016-02-08 DIAGNOSIS — K529 Noninfective gastroenteritis and colitis, unspecified: Secondary | ICD-10-CM | POA: Diagnosis not present

## 2016-03-27 DIAGNOSIS — R300 Dysuria: Secondary | ICD-10-CM | POA: Diagnosis not present

## 2016-03-27 DIAGNOSIS — R112 Nausea with vomiting, unspecified: Secondary | ICD-10-CM | POA: Diagnosis not present

## 2016-03-27 DIAGNOSIS — N760 Acute vaginitis: Secondary | ICD-10-CM | POA: Diagnosis not present

## 2016-03-27 DIAGNOSIS — J302 Other seasonal allergic rhinitis: Secondary | ICD-10-CM | POA: Diagnosis not present

## 2016-04-15 DIAGNOSIS — K9041 Non-celiac gluten sensitivity: Secondary | ICD-10-CM | POA: Diagnosis not present

## 2016-04-15 DIAGNOSIS — G43A0 Cyclical vomiting, not intractable: Secondary | ICD-10-CM | POA: Diagnosis not present

## 2016-04-15 DIAGNOSIS — R197 Diarrhea, unspecified: Secondary | ICD-10-CM | POA: Diagnosis not present

## 2016-05-01 DIAGNOSIS — K208 Other esophagitis: Secondary | ICD-10-CM | POA: Diagnosis not present

## 2016-05-01 DIAGNOSIS — G43A0 Cyclical vomiting, not intractable: Secondary | ICD-10-CM | POA: Diagnosis not present

## 2016-05-01 DIAGNOSIS — Z79899 Other long term (current) drug therapy: Secondary | ICD-10-CM | POA: Diagnosis not present

## 2016-05-01 DIAGNOSIS — R112 Nausea with vomiting, unspecified: Secondary | ICD-10-CM | POA: Diagnosis not present

## 2016-05-01 DIAGNOSIS — Z793 Long term (current) use of hormonal contraceptives: Secondary | ICD-10-CM | POA: Diagnosis not present

## 2016-05-01 DIAGNOSIS — Z7951 Long term (current) use of inhaled steroids: Secondary | ICD-10-CM | POA: Diagnosis not present

## 2016-05-01 DIAGNOSIS — R110 Nausea: Secondary | ICD-10-CM | POA: Diagnosis not present

## 2016-05-01 DIAGNOSIS — J45909 Unspecified asthma, uncomplicated: Secondary | ICD-10-CM | POA: Diagnosis not present

## 2016-05-01 DIAGNOSIS — Z975 Presence of (intrauterine) contraceptive device: Secondary | ICD-10-CM | POA: Diagnosis not present

## 2016-05-11 DIAGNOSIS — S139XXA Sprain of joints and ligaments of unspecified parts of neck, initial encounter: Secondary | ICD-10-CM | POA: Diagnosis not present

## 2016-05-14 DIAGNOSIS — R197 Diarrhea, unspecified: Secondary | ICD-10-CM | POA: Diagnosis not present

## 2016-05-14 DIAGNOSIS — R140 Abdominal distension (gaseous): Secondary | ICD-10-CM | POA: Diagnosis not present

## 2016-05-14 DIAGNOSIS — R109 Unspecified abdominal pain: Secondary | ICD-10-CM | POA: Diagnosis not present

## 2016-05-14 DIAGNOSIS — G43A0 Cyclical vomiting, not intractable: Secondary | ICD-10-CM | POA: Diagnosis not present

## 2016-05-21 DIAGNOSIS — Z01419 Encounter for gynecological examination (general) (routine) without abnormal findings: Secondary | ICD-10-CM | POA: Diagnosis not present

## 2016-05-21 DIAGNOSIS — Z6833 Body mass index (BMI) 33.0-33.9, adult: Secondary | ICD-10-CM | POA: Diagnosis not present

## 2016-05-23 DIAGNOSIS — R112 Nausea with vomiting, unspecified: Secondary | ICD-10-CM | POA: Diagnosis not present

## 2016-05-23 DIAGNOSIS — K219 Gastro-esophageal reflux disease without esophagitis: Secondary | ICD-10-CM | POA: Diagnosis not present

## 2016-06-05 DIAGNOSIS — R197 Diarrhea, unspecified: Secondary | ICD-10-CM | POA: Diagnosis not present

## 2016-06-11 DIAGNOSIS — R140 Abdominal distension (gaseous): Secondary | ICD-10-CM | POA: Diagnosis not present

## 2016-06-11 DIAGNOSIS — G43A0 Cyclical vomiting, not intractable: Secondary | ICD-10-CM | POA: Diagnosis not present

## 2016-06-11 DIAGNOSIS — K30 Functional dyspepsia: Secondary | ICD-10-CM | POA: Diagnosis not present

## 2016-07-17 DIAGNOSIS — Z79899 Other long term (current) drug therapy: Secondary | ICD-10-CM | POA: Diagnosis not present

## 2016-07-17 DIAGNOSIS — R197 Diarrhea, unspecified: Secondary | ICD-10-CM | POA: Diagnosis not present

## 2016-07-17 DIAGNOSIS — J45909 Unspecified asthma, uncomplicated: Secondary | ICD-10-CM | POA: Diagnosis not present

## 2016-07-17 DIAGNOSIS — D126 Benign neoplasm of colon, unspecified: Secondary | ICD-10-CM | POA: Diagnosis not present

## 2016-07-17 DIAGNOSIS — K219 Gastro-esophageal reflux disease without esophagitis: Secondary | ICD-10-CM | POA: Diagnosis not present

## 2016-08-06 DIAGNOSIS — R1084 Generalized abdominal pain: Secondary | ICD-10-CM | POA: Diagnosis not present

## 2016-08-06 DIAGNOSIS — K9041 Non-celiac gluten sensitivity: Secondary | ICD-10-CM | POA: Diagnosis not present

## 2016-08-06 DIAGNOSIS — R197 Diarrhea, unspecified: Secondary | ICD-10-CM | POA: Diagnosis not present

## 2016-08-06 DIAGNOSIS — G43A0 Cyclical vomiting, not intractable: Secondary | ICD-10-CM | POA: Diagnosis not present

## 2016-08-16 DIAGNOSIS — Z0000 Encounter for general adult medical examination without abnormal findings: Secondary | ICD-10-CM | POA: Diagnosis not present

## 2016-08-19 DIAGNOSIS — Z0000 Encounter for general adult medical examination without abnormal findings: Secondary | ICD-10-CM | POA: Diagnosis not present

## 2016-08-26 ENCOUNTER — Encounter: Payer: Self-pay | Admitting: Gynecologic Oncology

## 2016-08-26 ENCOUNTER — Ambulatory Visit: Payer: BLUE CROSS/BLUE SHIELD | Attending: Gynecologic Oncology | Admitting: Gynecologic Oncology

## 2016-08-26 VITALS — BP 125/79 | HR 103 | Temp 98.2°F | Resp 18 | Ht 62.0 in | Wt 179.0 lb

## 2016-08-26 DIAGNOSIS — K589 Irritable bowel syndrome without diarrhea: Secondary | ICD-10-CM | POA: Insufficient documentation

## 2016-08-26 DIAGNOSIS — Z9889 Other specified postprocedural states: Secondary | ICD-10-CM | POA: Insufficient documentation

## 2016-08-26 DIAGNOSIS — J45909 Unspecified asthma, uncomplicated: Secondary | ICD-10-CM | POA: Diagnosis not present

## 2016-08-26 DIAGNOSIS — Z79899 Other long term (current) drug therapy: Secondary | ICD-10-CM | POA: Insufficient documentation

## 2016-08-26 DIAGNOSIS — Z975 Presence of (intrauterine) contraceptive device: Secondary | ICD-10-CM | POA: Insufficient documentation

## 2016-08-26 DIAGNOSIS — E282 Polycystic ovarian syndrome: Secondary | ICD-10-CM | POA: Insufficient documentation

## 2016-08-26 DIAGNOSIS — K219 Gastro-esophageal reflux disease without esophagitis: Secondary | ICD-10-CM | POA: Insufficient documentation

## 2016-08-26 DIAGNOSIS — C541 Malignant neoplasm of endometrium: Secondary | ICD-10-CM | POA: Insufficient documentation

## 2016-08-26 DIAGNOSIS — N840 Polyp of corpus uteri: Secondary | ICD-10-CM | POA: Diagnosis not present

## 2016-08-26 NOTE — Progress Notes (Signed)
Follow-up Note: Gyn-Onc  Consult was initially requested by Dr. Valentino Saxon for the evaluation of Sherry [name] 30 y.o. female  CC:  Chief Complaint  Patient presents with  . Endometrial Cancer    Assessment/Plan:  Ms. Sherry [name]  is a 30 y.o.  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 S/p IUD placement in December 2016.  Improvement to [facility] on biopsy in February 2017 and to inactive glands and progestational changes in May, 2017.  Will follow-up biopsy result today.  Will repeat biopsy at 6 months only if new bleeding symptoms develop. Patient has no plans for fertility in the next 1-2 years, therefore, provided no progression to malignancy is identified, will continue IUD during that time.   HPI: Sherry [name] is a 30 year old G0 who was seen by my partner, Dr Andrew Au, in consultation at the request of Dr Valentino Saxon in December, 2016 for grade 1 endometrial cancer on biopsy.  She has a history of primary infertility and anovulation, but denies heavy vaginal bleeding.  She desires future fertility.  In December 2016 she underwent placement of progestin releasing IUD. Since placement she has had light vaginal bleeding daily. Biopsy of the endometrium in February 2017 showed focal glandular atypia (inactive endometrium with progestin effect, but a few small foci with nuclear atypia suspicious for complex atypical hyperplasia).  Interval History: Her biopsy in May, 2017 showed squamous morules and inactive endometrial fragments with progestational changes. She had a singular episode of postcoital bleeding (light) 2 weeks ago.   Current Meds:  Outpatient Encounter Prescriptions as of 08/26/2016  Medication Sig  . levonorgestrel (LILETTA) 18.6 MCG/DAY IUD IUD by Intrauterine route.  Marland Kitchen omeprazole (PRILOSEC) 20 MG capsule Take 20 mg by mouth as needed (heart burn). Reported on 01/19/2016  .  valACYclovir (VALTREX) 500 MG tablet Take 500 mg by mouth 2 (two) times daily. Reported on 01/19/2016  . [DISCONTINUED] dicyclomine (BENTYL) 10 MG capsule Take 30 minuters before meals as needed  . ibuprofen (ADVIL,MOTRIN) 600 MG tablet Take 1 tablet (600 mg total) by mouth every 6 (six) hours as needed. (Patient not taking: Reported on 08/26/2016)  . loratadine (CLARITIN) 10 MG tablet Take 10 mg by mouth daily. Reported on 10/23/2015  . ondansetron (ZOFRAN) 8 MG tablet Take by mouth.  . [DISCONTINUED] dicyclomine (BENTYL) 10 MG capsule TK 1 C PO 30 MINUTES BEFORE MEALS PRN  . [DISCONTINUED] metroNIDAZOLE (FLAGYL) 500 MG tablet Take 500 mg by mouth.  . [DISCONTINUED] nitrofurantoin, macrocrystal-monohydrate, (MACROBID) 100 MG capsule Take 100 mg by mouth 2 (two) times daily.   No facility-administered encounter medications on file as of 08/26/2016.     Allergy: No Known Allergies  Social Hx:   Social History   Social History  . Marital status: Single    Spouse name: N/A  . Number of children: N/A  . Years of education: N/A   Occupational History  . Not on file.   Social History Main Topics  . Smoking status: Never Smoker  . Smokeless tobacco: Never Used  . Alcohol use Yes  . Drug use: No  . Sexual activity: Yes    Birth control/ protection: Condom   Other Topics Concern  . Not on file   Social History Narrative  . No narrative on file    Past Surgical Hx:  Past Surgical History:  Procedure Laterality Date  . DILATATION & CURETTAGE/HYSTEROSCOPY WITH MYOSURE N/A 08/14/2015   Procedure: DILATATION & CURETTAGE/HYSTEROSCOPY  WITH MYOSURE/Polypectomy;  Surgeon: Aloha Gell, MD;  Location: [facility];  Service: Gynecology;  Laterality: N/A;  . MOUTH SURGERY    . WISDOM TOOTH EXTRACTION      Past Medical Hx:  Past Medical History:  Diagnosis Date  . Asthma   . Dysrhythmia    tachycardia  . GERD (gastroesophageal reflux disease)   . Gluten-sensitive enteropathy   . Headache     Migraines  . IBS (irritable bowel syndrome)   . PCOS (polycystic ovarian syndrome)   . PONV (postoperative nausea and vomiting)   . Seasonal allergies   . Strep throat     Past Gynecological History: G0, annovulation  No LMP recorded.  Family Hx: History reviewed. No pertinent family history.  Review of Systems:  Constitutional  Feels well,    ENT Normal appearing ears and nares bilaterally Skin/Breast  No rash, sores, jaundice, itching, dryness Cardiovascular  No chest pain, shortness of breath, or edema  Pulmonary  No cough or wheeze.  Gastro Intestinal  No nausea, vomitting, or diarrhoea. No bright red blood per rectum, no abdominal pain, change in bowel movement, or constipation.  Genito Urinary  No frequency, urgency, dysuria, no vaginal spotting Musculo Skeletal  No myalgia, arthralgia, joint swelling or pain  Neurologic  No weakness, numbness, change in gait,  Psychology  No depression, anxiety, insomnia.   Vitals:  Blood pressure 125/79, pulse (!) 103, temperature 98.2 F (36.8 C), temperature source Oral, resp. rate 18, height 5\' 2"  (1.575 m), weight 179 lb (81.2 kg), SpO2 99 %.  Physical Exam: WD in NAD Neck  Supple NROM, without any enlargements.  Lymph Node Survey No cervical supraclavicular or inguinal adenopathy Abdomen  Normoactive bowel sounds, abdomen soft, non-tender and obese without evidence of hernia.  Back No CVA tenderness Genito Urinary  Vulva/vagina: Normal external female genitalia.  No lesions. No discharge or bleeding.  Bladder/urethra:  No lesions or masses, well supported bladder  Vagina: grossly normal  Cervix: Normal appearing, no lesions. IUD strings in place  Uterus:  Small, mobile, no parametrial involvement or nodularity.  Adnexa: no palpable masses. Rectal  deferred Extremities  No bilateral cyanosis, clubbing or edema.  PROCEDURE NOTE:  Procedure: Endometrial Biopsy Surgeon: Everitt Amber, MD Indication: bleeding  with IUD in place with history of endometrial cancer. The procedure was explained Verbal consent obtained. Time out performed to verify procedure and patient. The speculum was placed and the cervix was visualized with IUD strings. The endometrial biopsy was performed with 1 pass of the endometrial biopsy pipelle. Moderate tissue was obtained. The uterus sounded to 7 cm.  The patient tolerated procedure. Specimen sent for surgical pathology. EBL minimal. Condition: stable. Complications: none.    Donaciano Eva, MD  08/26/2016, 3:53 PM  CC: Dr Andrew Au, Dr Aloha Gell

## 2016-08-26 NOTE — Patient Instructions (Signed)
Schedule follow up appointment in 6 months. Dr Denman George will contact you with your endometrial biopsy results.

## 2016-08-28 ENCOUNTER — Ambulatory Visit: Payer: BLUE CROSS/BLUE SHIELD | Admitting: Gynecologic Oncology

## 2016-08-30 ENCOUNTER — Telehealth: Payer: Self-pay

## 2016-08-30 NOTE — Telephone Encounter (Signed)
Pt notified of endometrial biopsy result. Verbalized understanding.

## 2016-09-12 ENCOUNTER — Ambulatory Visit: Payer: BLUE CROSS/BLUE SHIELD | Admitting: Gynecologic Oncology

## 2016-10-31 DIAGNOSIS — R300 Dysuria: Secondary | ICD-10-CM | POA: Diagnosis not present

## 2016-10-31 DIAGNOSIS — J302 Other seasonal allergic rhinitis: Secondary | ICD-10-CM | POA: Diagnosis not present

## 2016-11-14 DIAGNOSIS — G5601 Carpal tunnel syndrome, right upper limb: Secondary | ICD-10-CM | POA: Diagnosis not present

## 2016-11-14 DIAGNOSIS — G5602 Carpal tunnel syndrome, left upper limb: Secondary | ICD-10-CM | POA: Diagnosis not present

## 2017-01-10 DIAGNOSIS — R109 Unspecified abdominal pain: Secondary | ICD-10-CM | POA: Diagnosis not present

## 2017-04-30 DIAGNOSIS — K580 Irritable bowel syndrome with diarrhea: Secondary | ICD-10-CM | POA: Diagnosis not present

## 2017-04-30 DIAGNOSIS — K9041 Non-celiac gluten sensitivity: Secondary | ICD-10-CM | POA: Diagnosis not present

## 2017-05-29 DIAGNOSIS — R1084 Generalized abdominal pain: Secondary | ICD-10-CM | POA: Diagnosis not present

## 2017-05-29 DIAGNOSIS — K529 Noninfective gastroenteritis and colitis, unspecified: Secondary | ICD-10-CM | POA: Diagnosis not present

## 2017-05-29 DIAGNOSIS — R197 Diarrhea, unspecified: Secondary | ICD-10-CM | POA: Diagnosis not present

## 2017-05-30 DIAGNOSIS — Z975 Presence of (intrauterine) contraceptive device: Secondary | ICD-10-CM | POA: Diagnosis not present

## 2017-05-30 DIAGNOSIS — R109 Unspecified abdominal pain: Secondary | ICD-10-CM | POA: Diagnosis not present

## 2017-05-30 DIAGNOSIS — J45909 Unspecified asthma, uncomplicated: Secondary | ICD-10-CM | POA: Diagnosis not present

## 2017-05-30 DIAGNOSIS — K439 Ventral hernia without obstruction or gangrene: Secondary | ICD-10-CM | POA: Diagnosis not present

## 2017-05-30 DIAGNOSIS — R160 Hepatomegaly, not elsewhere classified: Secondary | ICD-10-CM | POA: Diagnosis not present

## 2017-05-30 DIAGNOSIS — K219 Gastro-esophageal reflux disease without esophagitis: Secondary | ICD-10-CM | POA: Diagnosis not present

## 2017-05-30 DIAGNOSIS — K529 Noninfective gastroenteritis and colitis, unspecified: Secondary | ICD-10-CM | POA: Diagnosis not present

## 2017-05-30 DIAGNOSIS — R197 Diarrhea, unspecified: Secondary | ICD-10-CM | POA: Diagnosis not present

## 2017-05-30 DIAGNOSIS — R112 Nausea with vomiting, unspecified: Secondary | ICD-10-CM | POA: Diagnosis not present

## 2017-05-30 DIAGNOSIS — Z85038 Personal history of other malignant neoplasm of large intestine: Secondary | ICD-10-CM | POA: Diagnosis not present

## 2017-07-18 ENCOUNTER — Telehealth: Payer: Self-pay | Admitting: *Deleted

## 2017-07-18 DIAGNOSIS — J3089 Other allergic rhinitis: Secondary | ICD-10-CM | POA: Diagnosis not present

## 2017-07-18 DIAGNOSIS — B009 Herpesviral infection, unspecified: Secondary | ICD-10-CM | POA: Diagnosis not present

## 2017-07-18 NOTE — Telephone Encounter (Signed)
Returned the patient's call and scheduled a follow up appt for January 9th.

## 2017-07-21 DIAGNOSIS — Z8041 Family history of malignant neoplasm of ovary: Secondary | ICD-10-CM | POA: Diagnosis not present

## 2017-07-21 DIAGNOSIS — N930 Postcoital and contact bleeding: Secondary | ICD-10-CM | POA: Diagnosis not present

## 2017-07-21 DIAGNOSIS — Z01419 Encounter for gynecological examination (general) (routine) without abnormal findings: Secondary | ICD-10-CM | POA: Diagnosis not present

## 2017-07-21 DIAGNOSIS — Z1159 Encounter for screening for other viral diseases: Secondary | ICD-10-CM | POA: Diagnosis not present

## 2017-07-21 DIAGNOSIS — Z118 Encounter for screening for other infectious and parasitic diseases: Secondary | ICD-10-CM | POA: Diagnosis not present

## 2017-07-21 DIAGNOSIS — Z8542 Personal history of malignant neoplasm of other parts of uterus: Secondary | ICD-10-CM | POA: Diagnosis not present

## 2017-07-21 DIAGNOSIS — Z113 Encounter for screening for infections with a predominantly sexual mode of transmission: Secondary | ICD-10-CM | POA: Diagnosis not present

## 2017-07-21 DIAGNOSIS — Z6832 Body mass index (BMI) 32.0-32.9, adult: Secondary | ICD-10-CM | POA: Diagnosis not present

## 2017-07-21 DIAGNOSIS — Z114 Encounter for screening for human immunodeficiency virus [HIV]: Secondary | ICD-10-CM | POA: Diagnosis not present

## 2017-07-25 DIAGNOSIS — N930 Postcoital and contact bleeding: Secondary | ICD-10-CM | POA: Diagnosis not present

## 2017-07-25 DIAGNOSIS — N926 Irregular menstruation, unspecified: Secondary | ICD-10-CM | POA: Diagnosis not present

## 2017-09-04 DIAGNOSIS — Z713 Dietary counseling and surveillance: Secondary | ICD-10-CM | POA: Diagnosis not present

## 2017-09-22 ENCOUNTER — Telehealth: Payer: Self-pay | Admitting: *Deleted

## 2017-09-22 NOTE — Telephone Encounter (Signed)
Called Dr. Jerrilyn Cairo office and requested the records from her last visit. Called the patient and explained that "we will review your records from the last visit, but more than likely you will have to come in to be seen."

## 2017-09-24 ENCOUNTER — Telehealth: Payer: Self-pay | Admitting: *Deleted

## 2017-09-24 ENCOUNTER — Ambulatory Visit: Payer: BLUE CROSS/BLUE SHIELD | Admitting: Gynecologic Oncology

## 2017-09-24 NOTE — Telephone Encounter (Signed)
Patient called and left a message to cancel her appt. She will call back to reschedule.

## 2017-10-10 DIAGNOSIS — Z0000 Encounter for general adult medical examination without abnormal findings: Secondary | ICD-10-CM | POA: Diagnosis not present

## 2017-10-21 DIAGNOSIS — G5603 Carpal tunnel syndrome, bilateral upper limbs: Secondary | ICD-10-CM | POA: Diagnosis not present

## 2017-10-28 DIAGNOSIS — Z713 Dietary counseling and surveillance: Secondary | ICD-10-CM | POA: Diagnosis not present

## 2017-11-18 ENCOUNTER — Telehealth: Payer: Self-pay | Admitting: *Deleted

## 2017-11-18 NOTE — Telephone Encounter (Signed)
Returned the patient's call and rescheduled her appt from [place]. Appt rescheduled to March 11th at 1:30pm

## 2017-11-21 DIAGNOSIS — G5601 Carpal tunnel syndrome, right upper limb: Secondary | ICD-10-CM | POA: Diagnosis not present

## 2017-11-24 ENCOUNTER — Encounter: Payer: Self-pay | Admitting: Gynecologic Oncology

## 2017-11-24 ENCOUNTER — Inpatient Hospital Stay: Payer: BLUE CROSS/BLUE SHIELD | Attending: Gynecologic Oncology | Admitting: Gynecologic Oncology

## 2017-11-24 VITALS — BP 124/85 | HR 99 | Temp 98.5°F | Resp 18 | Ht 62.0 in | Wt 180.3 lb

## 2017-11-24 DIAGNOSIS — C541 Malignant neoplasm of endometrium: Secondary | ICD-10-CM | POA: Insufficient documentation

## 2017-11-24 DIAGNOSIS — N8502 Endometrial intraepithelial neoplasia [EIN]: Secondary | ICD-10-CM | POA: Diagnosis not present

## 2017-11-24 NOTE — Progress Notes (Signed)
Follow-up Note: Gyn-Onc  Consult was initially requested by Dr. Valentino Saxon for the evaluation of Sherry [name] 32 y.o. female  CC:  Chief Complaint  Patient presents with  . Endometrial cancer [facility])    Assessment/Plan:  Ms. Sherry [name]  is a 32 y.o.  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 S/p IUD placement in December 2016.  Improvement to [facility] on biopsy in February 2017 and to inactive glands and progestational changes in May and December, 2017. Minimal symptoms.  Will follow-up today's biopsy. If remains negative, I feel that she does not need continued screening sampling unless she develops a change in symptoms. She will require new IUD placement in December 2021.     HPI: Sherry [name] is a 32 year old G0 who was seen by my partner, Dr Andrew Au, in consultation at the request of Dr Valentino Saxon in December, 2016 for grade 1 endometrial cancer on biopsy.  She has a history of primary infertility and anovulation, but denies heavy vaginal bleeding.  She desires future fertility.  In December 2016 she underwent placement of progestin releasing IUD. Since placement she has had light vaginal bleeding daily. Biopsy of the endometrium in February 2017 showed focal glandular atypia (inactive endometrium with progestin effect, but a few small foci with nuclear atypia suspicious for complex atypical hyperplasia).  Interval History: Her biopsy in May, 2017 showed squamous morules and inactive endometrial fragments with progestational changes. On 08/26/16 her biopsy showed endoemtroid polyps with decidualization of the stroma consistent with hormone effect. No hyperplasia or malignancy.   [place] on 07/25/17 showed a 7.8x4.5x3.1cm uterus with thin endometrium, IUD in place. Normal ovaries.   Current Meds:  Outpatient Encounter Medications as of 11/24/2017  Medication Sig  . ibuprofen (ADVIL,MOTRIN) 600 MG  tablet Take 1 tablet (600 mg total) by mouth every 6 (six) hours as needed. (Patient not taking: Reported on 08/26/2016)  . levonorgestrel (LILETTA) 18.6 MCG/DAY IUD IUD by Intrauterine route.  . loratadine (CLARITIN) 10 MG tablet Take 10 mg by mouth daily. Reported on 10/23/2015  . valACYclovir (VALTREX) 500 MG tablet Take 500 mg by mouth 2 (two) times daily. Reported on 01/19/2016  . [DISCONTINUED] omeprazole (PRILOSEC) 20 MG capsule Take 20 mg by mouth as needed (heart burn). Reported on 01/19/2016  . [DISCONTINUED] ondansetron (ZOFRAN) 8 MG tablet Take by mouth.   No facility-administered encounter medications on file as of 11/24/2017.     Allergy: No Known Allergies  Social Hx:   Social History   Socioeconomic History  . Marital status: Single    Spouse name: Not on file  . Number of children: Not on file  . Years of education: Not on file  . Highest education level: Not on file  Social Needs  . Financial resource strain: Not on file  . Food insecurity - worry: Not on file  . Food insecurity - inability: Not on file  . Transportation needs - medical: Not on file  . Transportation needs - non-medical: Not on file  Occupational History  . Not on file  Tobacco Use  . Smoking status: Never Smoker  . Smokeless tobacco: Never Used  Substance and Sexual Activity  . Alcohol use: Yes  . Drug use: No  . Sexual activity: Yes    Birth control/protection: Condom  Other Topics Concern  . Not on file  Social History Narrative  . Not on file    Past Surgical Hx:  Past Surgical History:  Procedure Laterality Date  . DILATATION & CURETTAGE/HYSTEROSCOPY WITH MYOSURE N/A 08/14/2015   Procedure: DILATATION & CURETTAGE/HYSTEROSCOPY WITH MYOSURE/Polypectomy;  Surgeon: Aloha Gell, MD;  Location: [facility];  Service: Gynecology;  Laterality: N/A;  . MOUTH SURGERY    . WISDOM TOOTH EXTRACTION      Past Medical Hx:  Past Medical History:  Diagnosis Date  . Asthma   . Dysrhythmia     tachycardia  . GERD (gastroesophageal reflux disease)   . Gluten-sensitive enteropathy   . Headache    Migraines  . IBS (irritable bowel syndrome)   . PCOS (polycystic ovarian syndrome)   . PONV (postoperative nausea and vomiting)   . Seasonal allergies   . Strep throat     Past Gynecological History: G0, annovulation  No LMP recorded.  Family Hx: History reviewed. No pertinent family history.  Review of Systems:  Constitutional  Feels well,    ENT Normal appearing ears and nares bilaterally Skin/Breast  No rash, sores, jaundice, itching, dryness Cardiovascular  No chest pain, shortness of breath, or edema  Pulmonary  No cough or wheeze.  Gastro Intestinal  No nausea, vomitting, or diarrhoea. No bright red blood per rectum, no abdominal pain, change in bowel movement, or constipation.  Genito Urinary  No frequency, urgency, dysuria, occasional vaginal spotting Musculo Skeletal  No myalgia, arthralgia, joint swelling or pain  Neurologic  No weakness, numbness, change in gait,  Psychology  No depression, anxiety, insomnia.   Vitals:  Blood pressure 124/85, pulse 99, temperature 98.5 F (36.9 C), temperature source Oral, resp. rate 18, height 5\' 2"  (1.575 m), weight 180 lb 4.8 oz (81.8 kg), SpO2 99 %.  Physical Exam: WD in NAD Neck  Supple NROM, without any enlargements.  Lymph Node Survey No cervical supraclavicular or inguinal adenopathy Abdomen  Normoactive bowel sounds, abdomen soft, non-tender and obese without evidence of hernia.  Back No CVA tenderness Genito Urinary  Vulva/vagina: Normal external female genitalia.  No lesions. No discharge or bleeding.  Bladder/urethra:  No lesions or masses, well supported bladder  Vagina: grossly normal  Cervix: Normal appearing, no lesions. IUD strings in place  Uterus:  Small, mobile, no parametrial involvement or nodularity.  Adnexa: no palpable masses. Rectal  deferred Extremities  No bilateral cyanosis,  clubbing or edema.  PROCEDURE NOTE:  Procedure: Endometrial Biopsy Surgeon: Everitt Amber, MD Indication: bleeding with IUD in place with history of endometrial cancer. The procedure was explained Verbal consent obtained. Time out performed to verify procedure and patient. The speculum was placed and the cervix was visualized with IUD strings. The endometrial biopsy was performed with 1 pass of the endometrial biopsy pipelle. Moderate tissue was obtained. The uterus sounded to 7 cm.  The patient tolerated procedure. Specimen sent for surgical pathology. EBL minimal. Condition: stable. Complications: none.    Thereasa Solo, MD  11/24/2017, 3:53 PM  CC: Dr Andrew Au, Dr Aloha Gell

## 2017-11-24 NOTE — Patient Instructions (Signed)
Dr Serita Grit office will contact you with the biopsy results.  If no cancer is identified, you do not require follow-up biopsies unless your bleeding changes.  Otherwise, you should have sampling performed at the time of the IUD removal in 2021.  Contact Dr Serita Grit office if your bleeding changes at [phone].

## 2017-11-25 DIAGNOSIS — L918 Other hypertrophic disorders of the skin: Secondary | ICD-10-CM | POA: Diagnosis not present

## 2017-11-25 DIAGNOSIS — D225 Melanocytic nevi of trunk: Secondary | ICD-10-CM | POA: Diagnosis not present

## 2017-12-04 ENCOUNTER — Telehealth: Payer: Self-pay

## 2017-12-04 NOTE — Telephone Encounter (Signed)
Told Sherry [name] the results and follow up plan from endometrial biopsy results as noted below by [name],NP.

## 2017-12-04 NOTE — Telephone Encounter (Signed)
-----   Message from Dorothyann Gibbs, NP sent at 12/04/2017 10:27 AM EDT ----- Please let her know biopsy shows hyperplasia or precancerous changes. Dr Denman George says continue with iud in place and follow up as scheduled ----- Message ----- From: Interface, Lab In [facility] Sent: 12/01/2017   7:18 PM To: Dorothyann Gibbs, NP

## 2017-12-08 DIAGNOSIS — J029 Acute pharyngitis, unspecified: Secondary | ICD-10-CM | POA: Diagnosis not present

## 2017-12-16 DIAGNOSIS — Z713 Dietary counseling and surveillance: Secondary | ICD-10-CM | POA: Diagnosis not present

## 2018-05-04 DIAGNOSIS — M546 Pain in thoracic spine: Secondary | ICD-10-CM | POA: Diagnosis not present

## 2018-05-04 DIAGNOSIS — M9902 Segmental and somatic dysfunction of thoracic region: Secondary | ICD-10-CM | POA: Diagnosis not present

## 2018-05-04 DIAGNOSIS — M542 Cervicalgia: Secondary | ICD-10-CM | POA: Diagnosis not present

## 2018-05-04 DIAGNOSIS — M9901 Segmental and somatic dysfunction of cervical region: Secondary | ICD-10-CM | POA: Diagnosis not present

## 2018-05-07 DIAGNOSIS — M9902 Segmental and somatic dysfunction of thoracic region: Secondary | ICD-10-CM | POA: Diagnosis not present

## 2018-05-07 DIAGNOSIS — M542 Cervicalgia: Secondary | ICD-10-CM | POA: Diagnosis not present

## 2018-05-07 DIAGNOSIS — M546 Pain in thoracic spine: Secondary | ICD-10-CM | POA: Diagnosis not present

## 2018-05-07 DIAGNOSIS — M9901 Segmental and somatic dysfunction of cervical region: Secondary | ICD-10-CM | POA: Diagnosis not present

## 2018-05-19 DIAGNOSIS — M9902 Segmental and somatic dysfunction of thoracic region: Secondary | ICD-10-CM | POA: Diagnosis not present

## 2018-05-19 DIAGNOSIS — M546 Pain in thoracic spine: Secondary | ICD-10-CM | POA: Diagnosis not present

## 2018-05-19 DIAGNOSIS — M542 Cervicalgia: Secondary | ICD-10-CM | POA: Diagnosis not present

## 2018-05-19 DIAGNOSIS — M9901 Segmental and somatic dysfunction of cervical region: Secondary | ICD-10-CM | POA: Diagnosis not present

## 2018-05-22 DIAGNOSIS — M9901 Segmental and somatic dysfunction of cervical region: Secondary | ICD-10-CM | POA: Diagnosis not present

## 2018-05-22 DIAGNOSIS — M9902 Segmental and somatic dysfunction of thoracic region: Secondary | ICD-10-CM | POA: Diagnosis not present

## 2018-05-22 DIAGNOSIS — M542 Cervicalgia: Secondary | ICD-10-CM | POA: Diagnosis not present

## 2018-05-22 DIAGNOSIS — M546 Pain in thoracic spine: Secondary | ICD-10-CM | POA: Diagnosis not present

## 2018-05-26 DIAGNOSIS — M542 Cervicalgia: Secondary | ICD-10-CM | POA: Diagnosis not present

## 2018-05-26 DIAGNOSIS — M546 Pain in thoracic spine: Secondary | ICD-10-CM | POA: Diagnosis not present

## 2018-05-26 DIAGNOSIS — M9902 Segmental and somatic dysfunction of thoracic region: Secondary | ICD-10-CM | POA: Diagnosis not present

## 2018-05-26 DIAGNOSIS — M9901 Segmental and somatic dysfunction of cervical region: Secondary | ICD-10-CM | POA: Diagnosis not present

## 2018-05-28 DIAGNOSIS — M9902 Segmental and somatic dysfunction of thoracic region: Secondary | ICD-10-CM | POA: Diagnosis not present

## 2018-05-28 DIAGNOSIS — M9901 Segmental and somatic dysfunction of cervical region: Secondary | ICD-10-CM | POA: Diagnosis not present

## 2018-05-28 DIAGNOSIS — M546 Pain in thoracic spine: Secondary | ICD-10-CM | POA: Diagnosis not present

## 2018-05-28 DIAGNOSIS — M542 Cervicalgia: Secondary | ICD-10-CM | POA: Diagnosis not present

## 2018-06-03 DIAGNOSIS — M9901 Segmental and somatic dysfunction of cervical region: Secondary | ICD-10-CM | POA: Diagnosis not present

## 2018-06-03 DIAGNOSIS — M542 Cervicalgia: Secondary | ICD-10-CM | POA: Diagnosis not present

## 2018-06-03 DIAGNOSIS — M546 Pain in thoracic spine: Secondary | ICD-10-CM | POA: Diagnosis not present

## 2018-06-03 DIAGNOSIS — M9902 Segmental and somatic dysfunction of thoracic region: Secondary | ICD-10-CM | POA: Diagnosis not present

## 2018-06-15 DIAGNOSIS — M9901 Segmental and somatic dysfunction of cervical region: Secondary | ICD-10-CM | POA: Diagnosis not present

## 2018-06-15 DIAGNOSIS — M546 Pain in thoracic spine: Secondary | ICD-10-CM | POA: Diagnosis not present

## 2018-06-15 DIAGNOSIS — M9902 Segmental and somatic dysfunction of thoracic region: Secondary | ICD-10-CM | POA: Diagnosis not present

## 2018-06-15 DIAGNOSIS — M542 Cervicalgia: Secondary | ICD-10-CM | POA: Diagnosis not present

## 2018-06-22 DIAGNOSIS — M542 Cervicalgia: Secondary | ICD-10-CM | POA: Diagnosis not present

## 2018-06-22 DIAGNOSIS — M9901 Segmental and somatic dysfunction of cervical region: Secondary | ICD-10-CM | POA: Diagnosis not present

## 2018-06-22 DIAGNOSIS — M546 Pain in thoracic spine: Secondary | ICD-10-CM | POA: Diagnosis not present

## 2018-06-22 DIAGNOSIS — M9902 Segmental and somatic dysfunction of thoracic region: Secondary | ICD-10-CM | POA: Diagnosis not present

## 2018-07-22 DIAGNOSIS — M9902 Segmental and somatic dysfunction of thoracic region: Secondary | ICD-10-CM | POA: Diagnosis not present

## 2018-07-22 DIAGNOSIS — M9901 Segmental and somatic dysfunction of cervical region: Secondary | ICD-10-CM | POA: Diagnosis not present

## 2018-07-22 DIAGNOSIS — M546 Pain in thoracic spine: Secondary | ICD-10-CM | POA: Diagnosis not present

## 2018-07-22 DIAGNOSIS — M542 Cervicalgia: Secondary | ICD-10-CM | POA: Diagnosis not present

## 2018-09-01 DIAGNOSIS — Z113 Encounter for screening for infections with a predominantly sexual mode of transmission: Secondary | ICD-10-CM | POA: Diagnosis not present

## 2018-09-01 DIAGNOSIS — Z8041 Family history of malignant neoplasm of ovary: Secondary | ICD-10-CM | POA: Diagnosis not present

## 2018-09-01 DIAGNOSIS — Z118 Encounter for screening for other infectious and parasitic diseases: Secondary | ICD-10-CM | POA: Diagnosis not present

## 2018-09-01 DIAGNOSIS — Z6833 Body mass index (BMI) 33.0-33.9, adult: Secondary | ICD-10-CM | POA: Diagnosis not present

## 2018-09-01 DIAGNOSIS — Z124 Encounter for screening for malignant neoplasm of cervix: Secondary | ICD-10-CM | POA: Diagnosis not present

## 2018-09-01 DIAGNOSIS — Z1151 Encounter for screening for human papillomavirus (HPV): Secondary | ICD-10-CM | POA: Diagnosis not present

## 2018-09-01 DIAGNOSIS — Z01419 Encounter for gynecological examination (general) (routine) without abnormal findings: Secondary | ICD-10-CM | POA: Diagnosis not present

## 2018-10-16 ENCOUNTER — Ambulatory Visit: Payer: BLUE CROSS/BLUE SHIELD | Admitting: Registered"

## 2018-11-24 DIAGNOSIS — Z23 Encounter for immunization: Secondary | ICD-10-CM | POA: Diagnosis not present

## 2018-11-24 DIAGNOSIS — J069 Acute upper respiratory infection, unspecified: Secondary | ICD-10-CM | POA: Diagnosis not present

## 2018-12-22 ENCOUNTER — Ambulatory Visit: Payer: BLUE CROSS/BLUE SHIELD | Admitting: Registered"

## 2018-12-23 ENCOUNTER — Ambulatory Visit: Payer: BLUE CROSS/BLUE SHIELD | Admitting: Dietician

## 2019-02-05 DIAGNOSIS — N390 Urinary tract infection, site not specified: Secondary | ICD-10-CM | POA: Diagnosis not present

## 2019-02-11 DIAGNOSIS — R300 Dysuria: Secondary | ICD-10-CM | POA: Diagnosis not present

## 2019-02-22 DIAGNOSIS — N760 Acute vaginitis: Secondary | ICD-10-CM | POA: Diagnosis not present

## 2019-02-22 DIAGNOSIS — R350 Frequency of micturition: Secondary | ICD-10-CM | POA: Diagnosis not present

## 2019-02-25 DIAGNOSIS — R0600 Dyspnea, unspecified: Secondary | ICD-10-CM | POA: Diagnosis not present

## 2019-02-25 DIAGNOSIS — J45901 Unspecified asthma with (acute) exacerbation: Secondary | ICD-10-CM | POA: Diagnosis not present

## 2019-02-25 DIAGNOSIS — Z1159 Encounter for screening for other viral diseases: Secondary | ICD-10-CM | POA: Diagnosis not present

## 2019-02-25 DIAGNOSIS — R079 Chest pain, unspecified: Secondary | ICD-10-CM | POA: Diagnosis not present

## 2019-04-14 DIAGNOSIS — R300 Dysuria: Secondary | ICD-10-CM | POA: Diagnosis not present

## 2019-05-11 DIAGNOSIS — J029 Acute pharyngitis, unspecified: Secondary | ICD-10-CM | POA: Diagnosis not present

## 2019-06-01 DIAGNOSIS — L700 Acne vulgaris: Secondary | ICD-10-CM | POA: Diagnosis not present

## 2019-07-13 DIAGNOSIS — L700 Acne vulgaris: Secondary | ICD-10-CM | POA: Diagnosis not present

## 2019-07-19 ENCOUNTER — Telehealth: Payer: Self-pay | Admitting: *Deleted

## 2019-07-19 NOTE — Telephone Encounter (Signed)
Patient called and stated "I saw Dr. Denman George and had endometrial ca. She said to call back if I had any issues. I started spotting three days ago, it is slowing down; but it is bright red blood. I have no cramping. I didn't know if it was from the antibiotics I'm on. I'm on medcycline." Explained that I would give the message to [facility] APP and we would call her back.

## 2019-07-19 NOTE — Telephone Encounter (Signed)
Told Ms Hallows that Zoila Shutter will discuss spotting with Dr. Denman George and IUD in 2016. Our office will let her know Dr. Serita Grit recommendations.

## 2019-07-19 NOTE — Telephone Encounter (Signed)
Patient had IUD placed 08/14/2015

## 2019-07-20 NOTE — Telephone Encounter (Signed)
Message Received: Today Message Contents  Cross, Carollee Massed, NP  Baruch Merl, RN        Sharyn Lull Hallows: Dr. Denman George said she can get in with her GYN for sampling/evaluation of the bleeding.

## 2019-07-20 NOTE — Telephone Encounter (Signed)
Told Ms Hallows that Dr. Denman George says to see gyn as noted below by Joylene John, NP. Ms Hallows verbalized understanding.

## 2019-08-26 DIAGNOSIS — Z0000 Encounter for general adult medical examination without abnormal findings: Secondary | ICD-10-CM | POA: Diagnosis not present

## 2019-09-02 DIAGNOSIS — J4520 Mild intermittent asthma, uncomplicated: Secondary | ICD-10-CM | POA: Diagnosis not present

## 2019-09-02 DIAGNOSIS — Z23 Encounter for immunization: Secondary | ICD-10-CM | POA: Diagnosis not present

## 2019-09-02 DIAGNOSIS — E282 Polycystic ovarian syndrome: Secondary | ICD-10-CM | POA: Diagnosis not present

## 2019-09-02 DIAGNOSIS — J309 Allergic rhinitis, unspecified: Secondary | ICD-10-CM | POA: Diagnosis not present

## 2019-09-02 DIAGNOSIS — E785 Hyperlipidemia, unspecified: Secondary | ICD-10-CM | POA: Diagnosis not present

## 2019-09-02 DIAGNOSIS — Z0000 Encounter for general adult medical examination without abnormal findings: Secondary | ICD-10-CM | POA: Diagnosis not present

## 2019-09-06 DIAGNOSIS — E663 Overweight: Secondary | ICD-10-CM | POA: Diagnosis not present

## 2019-09-06 DIAGNOSIS — E282 Polycystic ovarian syndrome: Secondary | ICD-10-CM | POA: Diagnosis not present

## 2019-09-06 DIAGNOSIS — Z8542 Personal history of malignant neoplasm of other parts of uterus: Secondary | ICD-10-CM | POA: Diagnosis not present

## 2019-09-06 DIAGNOSIS — Z3149 Encounter for other procreative investigation and testing: Secondary | ICD-10-CM | POA: Diagnosis not present

## 2019-11-15 DIAGNOSIS — U071 COVID-19: Secondary | ICD-10-CM

## 2019-11-15 HISTORY — DX: COVID-19: U07.1

## 2020-07-13 ENCOUNTER — Telehealth: Payer: Self-pay

## 2020-07-13 NOTE — Telephone Encounter (Signed)
TC from Ms. Hallows stating her recent endometrial biopsy at outside provider shows recurrence of endometrial cancer.  Appointment made for 07/21/2020 @ 2pm.  Patient stated [facility] will  Be sending referral and all records.

## 2020-07-21 ENCOUNTER — Other Ambulatory Visit: Payer: Self-pay | Admitting: Gynecologic Oncology

## 2020-07-21 ENCOUNTER — Inpatient Hospital Stay: Payer: BC Managed Care – PPO | Attending: Gynecologic Oncology | Admitting: Gynecologic Oncology

## 2020-07-21 ENCOUNTER — Encounter: Payer: Self-pay | Admitting: Gynecologic Oncology

## 2020-07-21 ENCOUNTER — Other Ambulatory Visit: Payer: Self-pay

## 2020-07-21 VITALS — BP 120/90 | HR 100 | Temp 97.9°F | Resp 17 | Ht 62.0 in | Wt 171.2 lb

## 2020-07-21 DIAGNOSIS — Z975 Presence of (intrauterine) contraceptive device: Secondary | ICD-10-CM | POA: Insufficient documentation

## 2020-07-21 DIAGNOSIS — N979 Female infertility, unspecified: Secondary | ICD-10-CM | POA: Insufficient documentation

## 2020-07-21 DIAGNOSIS — N970 Female infertility associated with anovulation: Secondary | ICD-10-CM | POA: Insufficient documentation

## 2020-07-21 DIAGNOSIS — K219 Gastro-esophageal reflux disease without esophagitis: Secondary | ICD-10-CM | POA: Insufficient documentation

## 2020-07-21 DIAGNOSIS — Z793 Long term (current) use of hormonal contraceptives: Secondary | ICD-10-CM | POA: Insufficient documentation

## 2020-07-21 DIAGNOSIS — E282 Polycystic ovarian syndrome: Secondary | ICD-10-CM | POA: Diagnosis not present

## 2020-07-21 DIAGNOSIS — J45909 Unspecified asthma, uncomplicated: Secondary | ICD-10-CM | POA: Insufficient documentation

## 2020-07-21 DIAGNOSIS — C541 Malignant neoplasm of endometrium: Secondary | ICD-10-CM | POA: Insufficient documentation

## 2020-07-21 DIAGNOSIS — K589 Irritable bowel syndrome without diarrhea: Secondary | ICD-10-CM | POA: Diagnosis not present

## 2020-07-21 NOTE — Progress Notes (Signed)
Follow-up Note: Gyn-Onc  Consult was initially requested by Dr. Valentino Saxon for the evaluation of Sherry [name] 34 y.o. female  CC:  Chief Complaint  Patient presents with  . Endometrial cancer [facility])    Assessment/Plan:  Ms. Sherry [name]  is a 34 y.o.  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 S/p IUD placement in December 2016. Persistent CAH, desire fertiliy.   As it has been 5 years since her [name] IUD was placed, I am recommending removal and replacement with D&C at that same time.  This will confirm that her uterine pathology is only CAH and not occult invasive carcinoma.  I am anticipating that her breakthrough bleeding symptoms are secondary to her IUD being at the end of its life span.  I anticipate that replacement with a new IUD may have improved efficacy.  Prior to the [facility] will perform pelvic MRI to evaluate for myometrial invasion.  If this is present and substantial, she may not be a candidate for future pregnancy, and we may need to discuss the need for hysterectomy.  If her D&C reveals either grade 1 carcinoma or CAH with an MRI shows no deep myometrial invasion, we would repeat sampling 3 months after IUD placement.  If this remains benign, she could consider removal of the IUD in the late spring to attempt fertility at Dr. Charlett Lango discretion.  I discussed the procedure of D&C and IUD placement including the anticipated risks and recovery of convalescence.   HPI: Sherry [name] is a 34 year old G0 who was seen by my partner, Dr Andrew Au, in consultation at the request of Dr Valentino Saxon in December, 2016 for grade 1 endometrial cancer on biopsy.  She has a history of primary infertility and anovulation, but denies heavy vaginal bleeding.  She desires future fertility.  In December 2016 she underwent placement of progestin releasing IUD. Since placement she has had light vaginal  bleeding daily. Biopsy of the endometrium in February 2017 showed focal glandular atypia (inactive endometrium with progestin effect, but a few small foci with nuclear atypia suspicious for complex atypical hyperplasia).  Her biopsy in May, 2017 showed squamous morules and inactive endometrial fragments with progestational changes. On 08/26/16 her biopsy showed endoemtroid polyps with decidualization of the stroma consistent with hormone effect. No hyperplasia or malignancy.   [place] on 07/25/17 showed a 7.8x4.5x3.1cm uterus with thin endometrium, IUD in place. Normal ovaries.   Interval History: Endometrial biopsy on 11/24/2017 showed complex hyperplasia with atypia.  There was only focal atypia within the complex hyperplasia.  She continued expectant follow-up but began experiencing heavy vaginal bleeding in the summer 2021 and presented to see Dr. Langley Gauss for evaluation.  Endometrial [place] biopsy on 07/12/2020 with Dr. Lanny Cramp showed complex hyperplasia with atypia in a background of changes consistent with progestin administration.  She strongly desires fertility with her new husband and is scheduled to see Dr. Kerin Perna to further discuss this on November 30.  Their tentative plan was to attempt fertility in April or May 2022.  Current Meds:  Outpatient Encounter Medications as of 07/21/2020  Medication Sig  . Cholecalciferol (VITAMIN D3) 50 MCG (2000 UT) CAPS Take 1 capsule by mouth daily.  . cyanocobalamin 100 MCG tablet Take by mouth.  . levonorgestrel (LILETTA) 18.6 MCG/DAY IUD IUD by Intrauterine route.  . loratadine (CLARITIN) 10 MG tablet Take 10 mg by mouth daily. Reported on 10/23/2015  . QSYMIA 7.5-46 MG CP24 Take 1 capsule  by mouth daily.  . Albuterol Sulfate, sensor, (PROAIR DIGIHALER) 108 (90 Base) MCG/ACT AEPB Inhale into the lungs. (Patient not taking: Reported on 07/21/2020)  . pantoprazole (PROTONIX) 40 MG tablet Take by mouth.  . valACYclovir (VALTREX) 500 MG tablet Take  500 mg by mouth 2 (two) times daily. Reported on 01/19/2016 (Patient not taking: Reported on 07/21/2020)  . [DISCONTINUED] ANTIFUNGAL 2 % cream Apply topically 2 (two) times daily.  . [DISCONTINUED] ibuprofen (ADVIL,MOTRIN) 600 MG tablet Take 1 tablet (600 mg total) by mouth every 6 (six) hours as needed. (Patient not taking: Reported on 08/26/2016)  . [DISCONTINUED] valACYclovir (VALTREX) 1000 MG tablet Take 1,000 mg by mouth daily.  . [DISCONTINUED] Vitamin D, Ergocalciferol, (DRISDOL) 1.25 MG (50000 UNIT) CAPS capsule Take 50,000 Units by mouth once a week.   No facility-administered encounter medications on file as of 07/21/2020.    Allergy:  Allergies  Allergen Reactions  . Gluten Meal Diarrhea and Nausea And Vomiting  . Cranberry Nausea And Vomiting    Social Hx:   Social History   Socioeconomic History  . Marital status: Single    Spouse name: Not on file  . Number of children: Not on file  . Years of education: Not on file  . Highest education level: Not on file  Occupational History  . Not on file  Tobacco Use  . Smoking status: Never Smoker  . Smokeless tobacco: Never Used  Vaping Use  . Vaping Use: Never used  Substance and Sexual Activity  . Alcohol use: Yes  . Drug use: No  . Sexual activity: Yes    Birth control/protection: Condom, I.U.D.  Other Topics Concern  . Not on file  Social History Narrative  . Not on file   Social Determinants of Health   Financial Resource Strain:   . Difficulty of Paying Living Expenses: Not on file  Food Insecurity:   . Worried About Charity fundraiser in the Last Year: Not on file  . Ran Out of Food in the Last Year: Not on file  Transportation Needs:   . Lack of Transportation (Medical): Not on file  . Lack of Transportation (Non-Medical): Not on file  Physical Activity:   . Days of Exercise per Week: Not on file  . Minutes of Exercise per Session: Not on file  Stress:   . Feeling of Stress : Not on file  Social  Connections:   . Frequency of Communication with Friends and Family: Not on file  . Frequency of Social Gatherings with Friends and Family: Not on file  . Attends Religious Services: Not on file  . Active Member of Clubs or Organizations: Not on file  . Attends Archivist Meetings: Not on file  . Marital Status: Not on file  Intimate Partner Violence:   . Fear of Current or Ex-Partner: Not on file  . Emotionally Abused: Not on file  . Physically Abused: Not on file  . Sexually Abused: Not on file    Past Surgical Hx:  Past Surgical History:  Procedure Laterality Date  . DILATATION & CURETTAGE/HYSTEROSCOPY WITH MYOSURE N/A 08/14/2015   Procedure: DILATATION & CURETTAGE/HYSTEROSCOPY WITH MYOSURE/Polypectomy;  Surgeon: Aloha Gell, MD;  Location: [facility];  Service: Gynecology;  Laterality: N/A;  . MOUTH SURGERY    . WISDOM TOOTH EXTRACTION      Past Medical Hx:  Past Medical History:  Diagnosis Date  . Asthma   . Dysrhythmia    tachycardia  . Endometrial cancer (  [place]) 2016  . GERD (gastroesophageal reflux disease)   . Gluten-sensitive enteropathy   . Headache    Migraines  . IBS (irritable bowel syndrome)   . PCOS (polycystic ovarian syndrome)   . Polycystic ovaries   . PONV (postoperative nausea and vomiting)   . Seasonal allergies   . Strep throat     Past Gynecological History: G0, annovulation  No LMP recorded.  Family Hx:  Family History  Problem Relation Age of Onset  . Hypertension Father   . Diabetes Paternal Aunt   . Cancer Paternal Grandmother        Non-Hogkins Lymphoma  . Heart disease Paternal Grandfather   . Heart attack Paternal Grandfather     Review of Systems:  Constitutional  Feels well,    ENT Normal appearing ears and nares bilaterally Skin/Breast  No rash, sores, jaundice, itching, dryness Cardiovascular  No chest pain, shortness of breath, or edema  Pulmonary  No cough or wheeze.  Gastro Intestinal  No nausea,  vomitting, or diarrhoea. No bright red blood per rectum, no abdominal pain, change in bowel movement, or constipation.  Genito Urinary  No frequency, urgency, dysuria, irregular vaginal bleeding Musculo Skeletal  No myalgia, arthralgia, joint swelling or pain  Neurologic  No weakness, numbness, change in gait,  Psychology  No depression, anxiety, insomnia.   Vitals:  Blood pressure 120/90, pulse 100, temperature 97.9 F (36.6 C), temperature source Tympanic, resp. rate 17, height 5\' 2"  (1.575 m), weight 171 lb 3.2 oz (77.7 kg), SpO2 100 %.  Physical Exam: WD in NAD Neck  Supple NROM, without any enlargements.  Lymph Node Survey No cervical supraclavicular or inguinal adenopathy Abdomen  Normoactive bowel sounds, abdomen soft, non-tender and obese without evidence of hernia.  Back No CVA tenderness Genito Urinary  Vulva/vagina: Normal external female genitalia.  No lesions. No discharge or bleeding.  Bladder/urethra:  No lesions or masses, well supported bladder  Vagina: grossly normal  Cervix: Normal appearing, no lesions. IUD strings in place  Uterus:  Small, mobile, no parametrial involvement or nodularity.  Adnexa: no palpable masses. Rectal  deferred Extremities  No bilateral cyanosis, clubbing or edema.   Thereasa Solo, MD  07/21/2020, 3:09 PM

## 2020-07-21 NOTE — Patient Instructions (Signed)
Preparing for your Surgery  Plan for surgery on August 07, 2020 with Dr. Everitt Amber at [facility]. You will be scheduled for a dilation and curettage of the uterus with Mirena IUD placement.   Pre-operative Testing -You will receive a phone call from presurgical testing at [facility] to arrange for a pre-operative appointment, labs if needed, and COVID test. The COVID test normally happens 3 days prior to the surgery and they ask that you self quarantine after the test up until surgery to decrease chance of exposure.  -Bring your insurance card, copy of an advanced directive if applicable, medication list  -You should not be taking blood thinners or aspirin at least ten days prior to surgery unless instructed by your surgeon.  -Do not take supplements such as fish oil (omega 3), red yeast rice, turmeric before your surgery.   Day Before Surgery at [facility] will be advised you can have clear liquids up until 3 hours before your surgery.    Your role in recovery Your role is to become active as soon as directed by your doctor, while still giving yourself time to heal.  Rest when you feel tired. You will be asked to do the following in order to speed your recovery:  - Cough and breathe deeply. This helps to clear and expand your lungs and can prevent pneumonia after surgery.  - [place]. Do mild physical activity. Walking or moving your legs help your circulation and body functions return to normal. Do not try to get up or walk alone the first time after surgery.   -If you develop swelling on one leg or the other, pain in the back of your leg, redness/warmth in one of your legs, please call the office or go to the Emergency Room to have a doppler to rule out a blood clot. For shortness of breath, chest pain-seek care in the Emergency Room as soon as possible. - Actively manage your pain. Managing your pain lets you move in comfort. We will  ask you to rate your pain on a scale of zero to 10. It is your responsibility to tell your doctor or nurse where and how much you hurt so your pain can be treated.  Special Considerations  -Your final pathology results from surgery should be available around one week after surgery and the results will be relayed to you when available.  -FMLA forms can be faxed to [phone] and please allow 5-7 business days for completion.  Pain Management After Surgery -Make sure that you have Tylenol and Ibuprofen at home to use on a regular basis after surgery for pain control. We recommend alternating the medications every hour to six hours since they work differently and are processed in the body differently for pain relief.  Bowel Regimen - It is important to prevent constipation and drink adequate amounts of liquids. You can use stool softeners, Miralax, or a laxative of your choice if needed.  Risks of Surgery Risks of surgery are low but include bleeding, infection, damage to surrounding structures, re-operation, blood clots, and very rarely death.  AFTER SURGERY INSTRUCTIONS  Return to work: 1 day if applicable  Activity: 1. Be up and out of the bed during the day.  Take a nap if needed.  You may walk up steps but be careful and use the hand rail.  Stair climbing will tire you more than you think, you may need to stop part  way and rest.   2. No driving for 48 hours after the procedure.  4. You can shower as soon as the next day after surgery. Shower daily.  Use your regular soap and water (not directly on the incision) and pat your incision(s) dry afterwards; don't rub.  No tub baths or submerging your body in water until cleared by your surgeon. If you have the soap that was given to you by pre-surgical testing that was used before surgery, you do not need to use it afterwards because this can irritate your incisions.   5. No sexual activity and nothing in the vagina for 2 weeks.  8. You  may experience vaginal spotting after surgery.  The spotting is normal but if you experience heavy bleeding, call our office.  9. Take Tylenol or ibuprofen for pain.  Monitor your Tylenol intake to a max of 4,000 mg in a 24 hour period. You can alternate these medications after surgery.  Diet: 1. Low sodium Heart Healthy Diet is recommended but you are cleared to resume your normal (before surgery) diet after your procedure.  2. It is safe to use a laxative, such as Miralax or Colace, if you have difficulty moving your bowels.   Wound Care: 1. Keep clean and dry.  Shower daily.  Reasons to call the Doctor:  Fever - Oral temperature greater than 100.4 degrees Fahrenheit  Foul-smelling vaginal discharge  Difficulty urinating  Nausea and vomiting  Increased pain at the site of the incision that is unrelieved with pain medicine.  Difficulty breathing with or without chest pain  New calf pain especially if only on one side  Sudden, continuing increased vaginal bleeding with or without clots.   Contacts: For questions or concerns you should contact:  Dr. Everitt Amber at [phone]  Joylene John, NP at [phone]  After Hours: call [phone] and have the GYN Oncologist paged/contacted (after 5 pm or on the weekends)   Dilation and Curettage or Vacuum Curettage  Dilation and curettage (D&C) and vacuum curettage are minor procedures. A D&C involves stretching (dilation) the cervix and scraping (curettage) the inside lining of the uterus (endometrium). During a D&C, tissue is gently scraped from the endometrium, starting from the top portion of the uterus down to the lowest part of the uterus (cervix). During a vacuum curettage, the lining and tissue in the uterus are removed with the use of gentle suction. Curettage may be performed to either diagnose or treat a problem. As a diagnostic procedure, curettage is performed to examine tissues from the uterus. A diagnostic  curettage may be done if you have:  Irregular bleeding in the uterus.  Bleeding with the development of clots.  Spotting between menstrual periods.  Prolonged menstrual periods or other abnormal bleeding.  Bleeding after menopause.  No menstrual period (amenorrhea).  A change in size and shape of the uterus.  Abnormal endometrial cells discovered during a Pap test. As a treatment procedure, curettage may be performed for the following reasons:  Removal of an IUD (intrauterine device).  Removal of retained placenta after giving birth.  Abortion.  Miscarriage.  Removal of endometrial polyps.  Removal of uncommon types of noncancerous lumps (fibroids). Tell a health care provider about:  Any allergies you have, including allergies to prescribed medicine or latex.  All medicines you are taking, including vitamins, herbs, eye drops, creams, and over-the-counter medicines. This is especially important if you take any blood-thinning medicine. Bring a list of all of your medicines to your  appointment.  Any problems you or family members have had with anesthetic medicines.  Any blood disorders you have.  Any surgeries you have had.  Your medical history and any medical conditions you have.  Whether you are pregnant or may be pregnant.  Recent vaginal infections you have had.  Recent menstrual periods, bleeding problems you have had, and what form of birth control (contraception) you use. What are the risks? Generally, this is a safe procedure. However, problems may occur, including:  Infection.  Heavy vaginal bleeding.  Allergic reactions to medicines.  Damage to the cervix or other structures or organs.  Development of scar tissue (adhesions) inside the uterus, which can cause abnormal amounts of menstrual bleeding. This may make it harder to get pregnant in the future.  A hole (perforation) or puncture in the uterine wall. This is rare. What happens before the  procedure? Staying hydrated Follow instructions from your health care provider about hydration  Eating and drinking restrictions Follow instructions from your health care provider about eating and drinking  Medicines  Ask your health care provider about: ? Changing or stopping your regular medicines. This is especially important if you are taking diabetes medicines or blood thinners. ? Taking medicines such as aspirin and ibuprofen. These medicines can thin your blood. Do not take these medicines before your procedure if your health care provider instructs you not to.  You may be given antibiotic medicine to help prevent infection. General instructions  For 24 hours before your procedure, do not: ? Douche. ? Use tampons. ? Use medicines, creams, or suppositories in the vagina. ? Have sexual intercourse.  You may be given a pregnancy test on the day of the procedure.  Plan to have someone take you home from the hospital or clinic.  You may have a blood or urine sample taken.  If you will be going home right after the procedure, plan to have someone with you for 24 hours. What happens during the procedure?  To reduce your risk of infection: ? Your health care team will wash or sanitize their hands. ? Your skin will be washed with soap.  An IV tube will be inserted into one of your veins.  You will be given one of the following: ? A medicine that numbs the area in and around the cervix (local anesthetic). ? A medicine to make you fall asleep (general anesthetic).  You will lie down on your back, with your feet in foot rests (stirrups).  The size and position of your uterus will be checked.  A lubricated instrument (speculum or Sims retractor) will be inserted into the back side of your vagina. The speculum will be used to hold apart the walls of your vagina so your health care provider can see your cervix.  A tool (tenaculum) will be attached to the lip of the cervix to  stabilize it.  Your cervix will be softened and dilated. This may be done by: ? Taking a medicine. ? Having tapered dilators or thin rods (laminaria) or gradual widening instruments (tapered dilators) inserted into your cervix.  A small, sharp, curved instrument (curette) will be used to scrape a small amount of tissue or cells from the endometrium or cervical canal. In some cases, gentle suction is applied with the curette. The curette will then be removed. The cells will be taken to a lab for testing. The procedure may vary among health care providers and hospitals. What happens after the procedure?  You may have  mild cramping, backache, pain, and light bleeding or spotting. You may pass small blood clots from your vagina.  You may have to wear compression stockings. These stockings help to prevent blood clots and reduce swelling in your legs.  Your blood pressure, heart rate, breathing rate, and blood oxygen level will be monitored until the medicines you were given have worn off. Summary  Dilation and curettage (D&C) involves stretching (dilation) the cervix and scraping (curettage) the inside lining of the uterus (endometrium).  After the procedure, you may have mild cramping, backache, pain, and light bleeding or spotting. You may pass small blood clots from your vagina.  Plan to have someone take you home from the hospital or clinic. This information is not intended to replace advice given to you by your health care provider. Make sure you discuss any questions you have with your health care provider. Document Revised: 08/15/2017 Document Reviewed: 05/19/2016 Elsevier Patient Education  [place].   Dilation and Curettage or Vacuum Curettage, Care After  These instructions give you information about caring for yourself after your procedure. Your doctor may also give you more specific instructions. Call your doctor if you have any problems or questions after your  procedure. Follow these instructions at home: Activity  Do not drive or use heavy machinery while taking prescription pain medicine.  For 24 hours after your procedure, avoid driving.  Take short walks often, followed by rest periods. Ask your doctor what activities are safe for you. After one or two days, you may be able to return to your normal activities.  Do not lift anything that is heavier than 10 lb (4.5 kg) until your doctor approves.  For at least 2 weeks, or as long as told by your doctor: ? Do not douche. ? Do not use tampons. ? Do not have sex. General instructions   Take over-the-counter and prescription medicines only as told by your doctor. This is very important if you take blood thinning medicine.  Do not take baths, swim, or use a hot tub until your doctor approves. Take showers instead of baths.  It is up to you to get the results of your procedure. Ask your doctor when your results will be ready.  Keep all follow-up visits as told by your doctor. This is important. Contact a doctor if:  You have very bad cramps that get worse or do not get better with medicine.  You have very bad pain in your belly (abdomen).  You cannot drink fluids without throwing up (vomiting).  You get pain in a different part of the area between your belly and thighs (pelvis).  You have bad-smelling discharge from your vagina.  You have a rash. Get help right away if:  You are bleeding a lot from your vagina. A lot of bleeding means soaking more than one sanitary pad in an hour, for 2 hours in a row.  You have clumps of blood (blood clots) coming from your vagina.  You have a fever or chills.  Your belly feels very tender or hard.  You have chest pain.  You have trouble breathing.  You cough up blood.  You feel dizzy.  You feel light-headed.  You pass out (faint).  You have pain in your neck or shoulder area. Summary  Take short walks often, followed by rest  periods. Ask your doctor what activities are safe for you. After one or two days, you may be able to return to your normal activities.  Do not lift anything that is heavier than 10 lb (4.5 kg) until your doctor approves.  Do not take baths, swim, or use a hot tub until your doctor approves. Take showers instead of baths.  Contact your doctor if you have any symptoms of infection, like bad-smelling discharge from your vagina. This information is not intended to replace advice given to you by your health care provider. Make sure you discuss any questions you have with your health care provider. Document Revised: 08/15/2017 Document Reviewed: 05/20/2016 Elsevier Patient Education  2020 [employer].

## 2020-07-21 NOTE — H&P (View-Only) (Signed)
Follow-up Note: Gyn-Onc  Consult was initially requested by Dr. Valentino Saxon for the evaluation of Sherry [name] 34 y.o. female  CC:  Chief Complaint  Patient presents with   Endometrial cancer [facility])    Assessment/Plan:  Ms. Sherry [name]  is a 34 y.o.  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 S/p IUD placement in December 2016. Persistent CAH, desire fertiliy.   As it has been 5 years since her Sherry [name] IUD was placed, I am recommending removal and replacement with D&C at that same time.  This will confirm that her uterine pathology is only CAH and not occult invasive carcinoma.  I am anticipating that her breakthrough bleeding symptoms are secondary to her IUD being at the end of its life span.  I anticipate that replacement with a new IUD may have improved efficacy.  Prior to the [facility] will perform pelvic MRI to evaluate for myometrial invasion.  If this is present and substantial, she may not be a candidate for future pregnancy, and we may need to discuss the need for hysterectomy.  If her D&C reveals either grade 1 carcinoma or CAH with an MRI shows no deep myometrial invasion, we would repeat sampling 3 months after IUD placement.  If this remains benign, she could consider removal of the IUD in the late spring to attempt fertility at Dr. Charlett Lango discretion.  I discussed the procedure of D&C and IUD placement including the anticipated risks and recovery of convalescence.   HPI: Sherry [name] is a 34 year old G0 who was seen by my partner, Dr Andrew Au, in consultation at the request of Dr Valentino Saxon in December, 2016 for grade 1 endometrial cancer on biopsy.  She has a history of primary infertility and anovulation, but denies heavy vaginal bleeding.  She desires future fertility.  In December 2016 she underwent placement of progestin releasing IUD. Since placement she has had light vaginal  bleeding daily. Biopsy of the endometrium in February 2017 showed focal glandular atypia (inactive endometrium with progestin effect, but a few small foci with nuclear atypia suspicious for complex atypical hyperplasia).  Her biopsy in May, 2017 showed squamous morules and inactive endometrial fragments with progestational changes. On 08/26/16 her biopsy showed endoemtroid polyps with decidualization of the stroma consistent with hormone effect. No hyperplasia or malignancy.   [place] on 07/25/17 showed a 7.8x4.5x3.1cm uterus with thin endometrium, IUD in place. Normal ovaries.   Interval History: Endometrial biopsy on 11/24/2017 showed complex hyperplasia with atypia.  There was only focal atypia within the complex hyperplasia.  She continued expectant follow-up but began experiencing heavy vaginal bleeding in the summer 2021 and presented to see Dr. Langley Gauss for evaluation.  Endometrial [place] biopsy on 07/12/2020 with Dr. Lanny Cramp showed complex hyperplasia with atypia in a background of changes consistent with progestin administration.  She strongly desires fertility with her new husband and is scheduled to see Dr. Kerin Perna to further discuss this on November 30.  Their tentative plan was to attempt fertility in April or May 2022.  Current Meds:  Outpatient Encounter Medications as of 07/21/2020  Medication Sig   Cholecalciferol (VITAMIN D3) 50 MCG (2000 UT) CAPS Take 1 capsule by mouth daily.   cyanocobalamin 100 MCG tablet Take by mouth.   levonorgestrel (LILETTA) 18.6 MCG/DAY IUD IUD by Intrauterine route.   loratadine (CLARITIN) 10 MG tablet Take 10 mg by mouth daily. Reported on 10/23/2015   QSYMIA 7.5-46 MG CP24 Take 1 capsule  by mouth daily.   Albuterol Sulfate, sensor, (PROAIR DIGIHALER) 108 (90 Base) MCG/ACT AEPB Inhale into the lungs. (Patient not taking: Reported on 07/21/2020)   pantoprazole (PROTONIX) 40 MG tablet Take by mouth.   valACYclovir (VALTREX) 500 MG tablet Take  500 mg by mouth 2 (two) times daily. Reported on 01/19/2016 (Patient not taking: Reported on 07/21/2020)   [DISCONTINUED] ANTIFUNGAL 2 % cream Apply topically 2 (two) times daily.   [DISCONTINUED] ibuprofen (ADVIL,MOTRIN) 600 MG tablet Take 1 tablet (600 mg total) by mouth every 6 (six) hours as needed. (Patient not taking: Reported on 08/26/2016)   [DISCONTINUED] valACYclovir (VALTREX) 1000 MG tablet Take 1,000 mg by mouth daily.   [DISCONTINUED] Vitamin D, Ergocalciferol, (DRISDOL) 1.25 MG (50000 UNIT) CAPS capsule Take 50,000 Units by mouth once a week.   No facility-administered encounter medications on file as of 07/21/2020.    Allergy:  Allergies  Allergen Reactions   Gluten Meal Diarrhea and Nausea And Vomiting   Cranberry Nausea And Vomiting    Social Hx:   Social History   Socioeconomic History   Marital status: Single    Spouse name: Not on file   Number of children: Not on file   Years of education: Not on file   Highest education level: Not on file  Occupational History   Not on file  Tobacco Use   Smoking status: Never Smoker   Smokeless tobacco: Never Used  Vaping Use   Vaping Use: Never used  Substance and Sexual Activity   Alcohol use: Yes   Drug use: No   Sexual activity: Yes    Birth control/protection: Condom, I.U.D.  Other Topics Concern   Not on file  Social History Narrative   Not on file   Social Determinants of Health   Financial Resource Strain:    Difficulty of Paying Living Expenses: Not on file  Food Insecurity:    Worried About [place] in the Last Year: Not on file   Ran Out of Food in the Last Year: Not on file  Transportation Needs:    Lack of Transportation (Medical): Not on file   Lack of Transportation (Non-Medical): Not on file  Physical Activity:    Days of Exercise per Week: Not on file   Minutes of Exercise per Session: Not on file  Stress:    Feeling of Stress : Not on file  Social  Connections:    Frequency of Communication with Friends and Family: Not on file   Frequency of Social Gatherings with Friends and Family: Not on file   Attends Religious Services: Not on file   Active Member of Clubs or Organizations: Not on file   Attends Archivist Meetings: Not on file   Marital Status: Not on file  Intimate Partner Violence:    Fear of Current or Ex-Partner: Not on file   Emotionally Abused: Not on file   Physically Abused: Not on file   Sexually Abused: Not on file    Past Surgical Hx:  Past Surgical History:  Procedure Laterality Date   DILATATION & CURETTAGE/HYSTEROSCOPY WITH MYOSURE N/A 08/14/2015   Procedure: [place];  Surgeon: Aloha Gell, MD;  Location: [facility];  Service: Gynecology;  Laterality: N/A;   MOUTH SURGERY     WISDOM TOOTH EXTRACTION      Past Medical Hx:  Past Medical History:  Diagnosis Date   Asthma    Dysrhythmia    tachycardia   Endometrial cancer (  [place]) 2016   GERD (gastroesophageal reflux disease)    Gluten-sensitive enteropathy    Headache    Migraines   IBS (irritable bowel syndrome)    PCOS (polycystic ovarian syndrome)    Polycystic ovaries    PONV (postoperative nausea and vomiting)    Seasonal allergies    Strep throat     Past Gynecological History: G0, annovulation  No LMP recorded.  Family Hx:  Family History  Problem Relation Age of Onset   Hypertension Father    Diabetes Paternal Aunt    Cancer Paternal Grandmother        Non-Hogkins Lymphoma   Heart disease Paternal Grandfather    Heart attack Paternal Grandfather     Review of Systems:  Constitutional  Feels well,    ENT Normal appearing ears and nares bilaterally Skin/Breast  No rash, sores, jaundice, itching, dryness Cardiovascular  No chest pain, shortness of breath, or edema  Pulmonary  No cough or wheeze.  Gastro Intestinal  No nausea,  vomitting, or diarrhoea. No bright red blood per rectum, no abdominal pain, change in bowel movement, or constipation.  Genito Urinary  No frequency, urgency, dysuria, irregular vaginal bleeding Musculo Skeletal  No myalgia, arthralgia, joint swelling or pain  Neurologic  No weakness, numbness, change in gait,  Psychology  No depression, anxiety, insomnia.   Vitals:  Blood pressure 120/90, pulse 100, temperature 97.9 F (36.6 C), temperature source Tympanic, resp. rate 17, height 5\' 2"  (1.575 m), weight 171 lb 3.2 oz (77.7 kg), SpO2 100 %.  Physical Exam: WD in NAD Neck  Supple NROM, without any enlargements.  Lymph Node Survey No cervical supraclavicular or inguinal adenopathy Abdomen  Normoactive bowel sounds, abdomen soft, non-tender and obese without evidence of hernia.  Back No CVA tenderness Genito Urinary  Vulva/vagina: Normal external female genitalia.  No lesions. No discharge or bleeding.  Bladder/urethra:  No lesions or masses, well supported bladder  Vagina: grossly normal  Cervix: Normal appearing, no lesions. IUD strings in place  Uterus:  Small, mobile, no parametrial involvement or nodularity.  Adnexa: no palpable masses. Rectal  deferred Extremities  No bilateral cyanosis, clubbing or edema.   Thereasa Solo, MD  07/21/2020, 3:09 PM

## 2020-07-29 ENCOUNTER — Ambulatory Visit (HOSPITAL_BASED_OUTPATIENT_CLINIC_OR_DEPARTMENT_OTHER)
Admission: RE | Admit: 2020-07-29 | Discharge: 2020-07-29 | Disposition: A | Discharge status: Home / Self Care | Payer: BC Managed Care – PPO | Source: Ambulatory Visit | Attending: Gynecologic Oncology | Admitting: Gynecologic Oncology

## 2020-07-29 ENCOUNTER — Other Ambulatory Visit: Payer: Self-pay

## 2020-07-29 DIAGNOSIS — C541 Malignant neoplasm of endometrium: Secondary | ICD-10-CM | POA: Insufficient documentation

## 2020-07-29 MED ORDER — GADOBUTROL 1 MMOL/ML IV SOLN
7.5000 mL | Freq: Once | INTRAVENOUS | Status: AC | PRN
  Administered 2020-07-29: 7.5 mL via INTRAVENOUS

## 2020-07-31 ENCOUNTER — Encounter (HOSPITAL_BASED_OUTPATIENT_CLINIC_OR_DEPARTMENT_OTHER): Payer: Self-pay | Admitting: Gynecologic Oncology

## 2020-07-31 ENCOUNTER — Other Ambulatory Visit: Payer: Self-pay

## 2020-07-31 NOTE — Progress Notes (Signed)
Spoke w/ via phone for pre-op interview---pt Lab needs dos---- urine preg              Lab results------none COVID test ------08-03-2020 at 815 am Arrive at -------745 am 08-07-2020 NPO after MN NO Solid Food.  Clear liquids from MN until---645 am then npo Medications to take morning of surgery -----proair inhaler prn/bring inhaler, loratadine Diabetic medication -----n/a Patient Special Instructions -----none Pre-Op special Istructions -----none Patient verbalized understanding of instructions that were given at this phone interview. Patient denies shortness of breath, chest pain, fever, cough at this phone interview.

## 2020-08-02 ENCOUNTER — Telehealth: Payer: Self-pay

## 2020-08-02 NOTE — Telephone Encounter (Signed)
LM for pat to call back to discuss the results of the MRI as noted below. The IUD is in place. No visible endometrial lesion. Proceed with plan for D&C and IUD replacemnet.

## 2020-08-02 NOTE — Telephone Encounter (Signed)
Told Sherry [name] the results of the MRI as noted below. Pt verbalized understanding.

## 2020-08-03 ENCOUNTER — Other Ambulatory Visit (HOSPITAL_COMMUNITY)
Admission: RE | Admit: 2020-08-03 | Discharge: 2020-08-03 | Disposition: A | Discharge status: Home / Self Care | Payer: BC Managed Care – PPO | Source: Ambulatory Visit | Attending: Gynecologic Oncology | Admitting: Gynecologic Oncology

## 2020-08-03 DIAGNOSIS — Z01812 Encounter for preprocedural laboratory examination: Secondary | ICD-10-CM | POA: Diagnosis present

## 2020-08-03 DIAGNOSIS — Z20822 Contact with and (suspected) exposure to covid-19: Secondary | ICD-10-CM | POA: Diagnosis not present

## 2020-08-03 LAB — SARS CORONAVIRUS 2 (TAT 6-24 HRS): SARS Coronavirus 2: NEGATIVE

## 2020-08-04 ENCOUNTER — Telehealth: Payer: Self-pay

## 2020-08-04 NOTE — Telephone Encounter (Signed)
LM for Sherry [name]  to see if she has any questions regarding her surgery on 11-22.  Requested that she call back to the office at [phone] if she has any questions or concerns.

## 2020-08-05 NOTE — Anesthesia Preprocedure Evaluation (Addendum)
Anesthesia Evaluation  Patient identified by MRN, date of birth, ID band Patient awake    Reviewed: Allergy & Precautions, NPO status , Patient's Chart, lab work & pertinent test results  History of Anesthesia Complications (+) PONV  Airway Mallampati: II  TM Distance: >3 FB Neck ROM: Full    Dental no notable dental hx. (+) Teeth Intact, Dental Advisory Given   Pulmonary asthma ,  Covid positive 11/2019   Pulmonary exam normal breath sounds clear to auscultation       Cardiovascular negative cardio ROS Normal cardiovascular exam Rhythm:Regular Rate:Normal     Neuro/Psych  Headaches, negative psych ROS   GI/Hepatic negative GI ROS, Neg liver ROS,   Endo/Other  negative endocrine ROSPCOS  Renal/GU negative Renal ROS  negative genitourinary   Musculoskeletal negative musculoskeletal ROS (+)   Abdominal   Peds  Hematology negative hematology ROS (+)   Anesthesia Other Findings   Reproductive/Obstetrics H/o endometrial CA                            Anesthesia Physical Anesthesia Plan  ASA: II  Anesthesia Plan: General   Post-op Pain Management:    Induction: Intravenous  PONV Risk Score and Plan: 4 or greater and Ondansetron, Dexamethasone, Midazolam and Scopolamine patch - Pre-op  Airway Management Planned: LMA  Additional Equipment:   Intra-op Plan:   Post-operative Plan: Extubation in OR  Informed Consent: I have reviewed the patients History and Physical, chart, labs and discussed the procedure including the risks, benefits and alternatives for the proposed anesthesia with the patient or authorized representative who has indicated his/her understanding and acceptance.     Dental advisory given  Plan Discussed with: CRNA  Anesthesia Plan Comments:         Anesthesia Quick Evaluation

## 2020-08-07 ENCOUNTER — Ambulatory Visit (HOSPITAL_BASED_OUTPATIENT_CLINIC_OR_DEPARTMENT_OTHER): Payer: BC Managed Care – PPO | Admitting: Anesthesiology

## 2020-08-07 ENCOUNTER — Ambulatory Visit (HOSPITAL_BASED_OUTPATIENT_CLINIC_OR_DEPARTMENT_OTHER)
Admission: RE | Admit: 2020-08-07 | Discharge: 2020-08-07 | Disposition: A | Discharge status: Home / Self Care | Payer: BC Managed Care – PPO | Attending: Gynecologic Oncology | Admitting: Gynecologic Oncology

## 2020-08-07 ENCOUNTER — Encounter (HOSPITAL_BASED_OUTPATIENT_CLINIC_OR_DEPARTMENT_OTHER)
Admission: RE | Disposition: A | Discharge status: Home / Self Care | Payer: Self-pay | Source: Home / Self Care | Attending: Gynecologic Oncology

## 2020-08-07 ENCOUNTER — Encounter (HOSPITAL_BASED_OUTPATIENT_CLINIC_OR_DEPARTMENT_OTHER): Payer: Self-pay | Admitting: Gynecologic Oncology

## 2020-08-07 DIAGNOSIS — C541 Malignant neoplasm of endometrium: Secondary | ICD-10-CM | POA: Diagnosis not present

## 2020-08-07 DIAGNOSIS — N8502 Endometrial intraepithelial neoplasia [EIN]: Secondary | ICD-10-CM

## 2020-08-07 DIAGNOSIS — Z79899 Other long term (current) drug therapy: Secondary | ICD-10-CM | POA: Diagnosis not present

## 2020-08-07 DIAGNOSIS — Z975 Presence of (intrauterine) contraceptive device: Secondary | ICD-10-CM | POA: Diagnosis not present

## 2020-08-07 DIAGNOSIS — N858 Other specified noninflammatory disorders of uterus: Secondary | ICD-10-CM | POA: Diagnosis not present

## 2020-08-07 HISTORY — PX: DILATION AND CURETTAGE OF UTERUS: SHX78

## 2020-08-07 HISTORY — PX: INTRAUTERINE DEVICE (IUD) INSERTION: SHX5877

## 2020-08-07 LAB — POCT PREGNANCY, URINE: Preg Test, Ur: NEGATIVE

## 2020-08-07 SURGERY — DILATION AND CURETTAGE
Anesthesia: General | Site: Uterus

## 2020-08-07 MED ORDER — PROPOFOL 10 MG/ML IV BOLUS
INTRAVENOUS | Status: DC | PRN
  Administered 2020-08-07: 200 mg via INTRAVENOUS

## 2020-08-07 MED ORDER — LEVONORGESTREL 20 MCG/24HR IU IUD
INTRAUTERINE_SYSTEM | INTRAUTERINE | Status: AC
  Filled 2020-08-07: qty 1

## 2020-08-07 MED ORDER — LIDOCAINE HCL 1 % IJ SOLN
INTRAMUSCULAR | Status: DC | PRN
  Administered 2020-08-07: 10 mL

## 2020-08-07 MED ORDER — SCOPOLAMINE 1 MG/3DAYS TD PT72
1.0000 | MEDICATED_PATCH | TRANSDERMAL | Status: DC
  Administered 2020-08-07: 1.5 mg via TRANSDERMAL

## 2020-08-07 MED ORDER — ACETAMINOPHEN 500 MG PO TABS
ORAL_TABLET | ORAL | Status: AC
  Filled 2020-08-07: qty 2

## 2020-08-07 MED ORDER — TRAMADOL HCL 50 MG PO TABS
50.0000 mg | ORAL_TABLET | Freq: Four times a day (QID) | ORAL | Status: DC | PRN
  Administered 2020-08-07: 50 mg via ORAL

## 2020-08-07 MED ORDER — ACETAMINOPHEN 500 MG PO TABS
1000.0000 mg | ORAL_TABLET | Freq: Once | ORAL | Status: AC
  Administered 2020-08-07: 1000 mg via ORAL

## 2020-08-07 MED ORDER — ACETAMINOPHEN 500 MG PO TABS: 1000.0000 mg | ORAL_TABLET | ORAL | Status: DC

## 2020-08-07 MED ORDER — LACTATED RINGERS IV SOLN: INTRAVENOUS | Status: DC

## 2020-08-07 MED ORDER — MIDAZOLAM HCL 2 MG/2ML IJ SOLN
INTRAMUSCULAR | Status: AC
  Filled 2020-08-07: qty 2

## 2020-08-07 MED ORDER — DEXAMETHASONE SODIUM PHOSPHATE 10 MG/ML IJ SOLN
INTRAMUSCULAR | Status: AC
  Filled 2020-08-07: qty 1

## 2020-08-07 MED ORDER — SCOPOLAMINE 1 MG/3DAYS TD PT72
MEDICATED_PATCH | TRANSDERMAL | Status: AC
  Filled 2020-08-07: qty 1

## 2020-08-07 MED ORDER — LEVONORGESTREL 20 MCG/24HR IU IUD
INTRAUTERINE_SYSTEM | INTRAUTERINE | Status: AC
  Administered 2020-08-07: 1 via INTRAUTERINE

## 2020-08-07 MED ORDER — ONDANSETRON HCL 4 MG/2ML IJ SOLN
INTRAMUSCULAR | Status: AC
  Filled 2020-08-07: qty 2

## 2020-08-07 MED ORDER — FENTANYL CITRATE (PF) 100 MCG/2ML IJ SOLN
INTRAMUSCULAR | Status: AC
  Filled 2020-08-07: qty 2

## 2020-08-07 MED ORDER — DEXAMETHASONE SODIUM PHOSPHATE 10 MG/ML IJ SOLN
INTRAMUSCULAR | Status: DC | PRN
  Administered 2020-08-07: 5 mg via INTRAVENOUS

## 2020-08-07 MED ORDER — LIDOCAINE 2% (20 MG/ML) 5 ML SYRINGE
INTRAMUSCULAR | Status: DC | PRN
  Administered 2020-08-07: 60 mg via INTRAVENOUS

## 2020-08-07 MED ORDER — PROPOFOL 10 MG/ML IV BOLUS
INTRAVENOUS | Status: AC
  Filled 2020-08-07: qty 20

## 2020-08-07 MED ORDER — CELECOXIB 200 MG PO CAPS
400.0000 mg | ORAL_CAPSULE | ORAL | Status: AC
  Administered 2020-08-07: 400 mg via ORAL

## 2020-08-07 MED ORDER — ONDANSETRON HCL 4 MG/2ML IJ SOLN: 4.0000 mg | Freq: Four times a day (QID) | INTRAMUSCULAR | Status: DC | PRN

## 2020-08-07 MED ORDER — CELECOXIB 200 MG PO CAPS
ORAL_CAPSULE | ORAL | Status: AC
  Filled 2020-08-07: qty 2

## 2020-08-07 MED ORDER — TRAMADOL HCL 50 MG PO TABS
ORAL_TABLET | ORAL | Status: AC
  Filled 2020-08-07: qty 1

## 2020-08-07 MED ORDER — FENTANYL CITRATE (PF) 100 MCG/2ML IJ SOLN: 25.0000 ug | INTRAMUSCULAR | Status: DC | PRN

## 2020-08-07 MED ORDER — OXYCODONE HCL 5 MG PO TABS: 5.0000 mg | ORAL_TABLET | ORAL | Status: DC | PRN

## 2020-08-07 MED ORDER — LIDOCAINE HCL (PF) 2 % IJ SOLN
INTRAMUSCULAR | Status: AC
  Filled 2020-08-07: qty 5

## 2020-08-07 MED ORDER — ONDANSETRON HCL 4 MG PO TABS: 4.0000 mg | ORAL_TABLET | Freq: Four times a day (QID) | ORAL | Status: DC | PRN

## 2020-08-07 MED ORDER — ONDANSETRON HCL 4 MG/2ML IJ SOLN
INTRAMUSCULAR | Status: DC | PRN
  Administered 2020-08-07: 4 mg via INTRAVENOUS

## 2020-08-07 MED ORDER — FENTANYL CITRATE (PF) 100 MCG/2ML IJ SOLN
INTRAMUSCULAR | Status: DC | PRN
  Administered 2020-08-07: 50 ug via INTRAVENOUS

## 2020-08-07 MED ORDER — MIDAZOLAM HCL 5 MG/5ML IJ SOLN
INTRAMUSCULAR | Status: DC | PRN
  Administered 2020-08-07: 2 mg via INTRAVENOUS

## 2020-08-07 SURGICAL SUPPLY — 13 items
CATH ROBINSON RED A/P 16FR (CATHETERS) ×2 IMPLANT
COVER WAND RF STERILE (DRAPES) ×2 IMPLANT
GLOVE BIO SURGEON STRL SZ 6 (GLOVE) ×4 IMPLANT
GOWN STRL REUS W/TWL LRG LVL3 (GOWN DISPOSABLE) ×2 IMPLANT
KIT TURNOVER CYSTO (KITS) ×2 IMPLANT
NEEDLE HYPO 22GX1.5 SAFETY (NEEDLE) ×2 IMPLANT
PACK VAGINAL MINOR WOMEN LF (CUSTOM PROCEDURE TRAY) ×2 IMPLANT
PAD OB MATERNITY 4.3X12.25 (PERSONAL CARE ITEMS) ×2 IMPLANT
SUT VIC AB 0 CT1 36 (SUTURE) ×2 IMPLANT
SUT VIC AB 2-0 UR6 27 (SUTURE) IMPLANT
TOWEL OR 17X26 10 PK STRL BLUE (TOWEL DISPOSABLE) ×2 IMPLANT
WATER STERILE IRR 500ML POUR (IV SOLUTION) ×2 IMPLANT
mirena ×2 IMPLANT

## 2020-08-07 NOTE — Discharge Instructions (Addendum)
Dilation and Curettage or Vacuum Curettage, Care After  You can use Tylenol or Ibuprofen for pain after your procedure. Monitor for constipation and use over the counter medication (examples include sennakot-S, Miralax) as needed.  Call Dr. Serita Grit office for any questions or concerns at [phone]. After hours number is [phone] (after 5pm or on the weekends). Follow up with Dr. Denman George as needed.  These instructions give you information about caring for yourself after your procedure. Your doctor may also give you more specific instructions. Call your doctor if you have any problems or questions after your procedure. Follow these instructions at home: Activity  Do not drive or use heavy machinery while taking prescription pain medicine.  For 48 hours after your procedure, avoid driving.  Take short walks often, followed by rest periods. Ask your doctor what activities are safe for you. After one or two days, you may be able to return to your normal activities.  Do not lift anything that is heavier than 10 lb (4.5 kg) until your doctor approves.  For at least 4 weeks, or as long as told by your doctor: ? Do not douche. ? Do not use tampons. ? Do not have sex. General instructions   Take over-the-counter and prescription medicines only as told by your doctor. This is very important if you take blood thinning medicine.  Do not take baths, swim, or use a hot tub until your doctor approves. Take showers instead of baths.  It is up to you to get the results of your procedure. Ask your doctor when your results will be ready.  Keep all follow-up visits as told by your doctor. This is important. Contact a doctor if:  You have very bad cramps that get worse or do not get better with medicine.  You have very bad pain in your belly (abdomen).  You cannot drink fluids without throwing up (vomiting).  You get pain in a different part of the area between your belly and thighs  (pelvis).  You have bad-smelling discharge from your vagina.  You have a rash. Get help right away if:  You are bleeding a lot from your vagina. A lot of bleeding means soaking more than one sanitary pad in an hour, for 2 hours in a row.  You have clumps of blood (blood clots) coming from your vagina.  You have a fever or chills.  Your belly feels very tender or hard.  You have chest pain.  You have trouble breathing.  You cough up blood.  You feel dizzy.  You feel light-headed.  You pass out (faint).  You have pain in your neck or shoulder area. Summary  Take short walks often, followed by rest periods. Ask your doctor what activities are safe for you. After one or two days, you may be able to return to your normal activities.  Do not lift anything that is heavier than 10 lb (4.5 kg) until your doctor approves.  Do not take baths, swim, or use a hot tub until your doctor approves. Take showers instead of baths.  Contact your doctor if you have any symptoms of infection, like bad-smelling discharge from your vagina. This information is not intended to replace advice given to you by your health care provider. Make sure you discuss any questions you have with your health care provider. Document Revised: 08/15/2017 Document Reviewed: 05/20/2016 Elsevier Patient Education  [employer].  Levonorgestrel intrauterine device (IUD) What is this medicine? LEVONORGESTREL IUD (LEE voe nor jes  trel) is a contraceptive (birth control) device. The device is placed inside the uterus by a healthcare professional. It is used to prevent pregnancy. This device can also be used to treat heavy bleeding that occurs during your period. This medicine may be used for other purposes; ask your health care provider or pharmacist if you have questions. COMMON BRAND NAME(S): Minette Headland What should I tell my health care provider before I take this medicine? They need to  know if you have any of these conditions:  abnormal Pap smear  cancer of the breast, uterus, or cervix  diabetes  endometritis  genital or pelvic infection now or in the past  have more than one sexual partner or your partner has more than one partner  heart disease  history of an ectopic or tubal pregnancy  immune system problems  IUD in place  liver disease or tumor  problems with blood clots or take blood-thinners  seizures  use intravenous drugs  uterus of unusual shape  vaginal bleeding that has not been explained  an unusual or allergic reaction to levonorgestrel, other hormones, silicone, or polyethylene, medicines, foods, dyes, or preservatives  pregnant or trying to get pregnant  breast-feeding How should I use this medicine? This device is placed inside the uterus by a health care professional. Talk to your pediatrician regarding the use of this medicine in children. Special care may be needed. Overdosage: If you think you have taken too much of this medicine contact a poison control center or emergency room at once. NOTE: This medicine is only for you. Do not share this medicine with others. What if I miss a dose? This does not apply. Depending on the brand of device you have inserted, the device will need to be replaced every 3 to 6 years if you wish to continue using this type of birth control. What may interact with this medicine? Do not take this medicine with any of the following medications:  amprenavir  bosentan  fosamprenavir This medicine may also interact with the following medications:  aprepitant  armodafinil  barbiturate medicines for inducing sleep or treating seizures  bexarotene  boceprevir  griseofulvin  medicines to treat seizures like carbamazepine, ethotoin, felbamate, oxcarbazepine, phenytoin, topiramate  modafinil  pioglitazone  rifabutin  rifampin  rifapentine  some medicines to treat HIV infection like  atazanavir, efavirenz, indinavir, lopinavir, nelfinavir, tipranavir, ritonavir  St. John's wort  warfarin This list may not describe all possible interactions. Give your health care provider a list of all the medicines, herbs, non-prescription drugs, or dietary supplements you use. Also tell them if you smoke, drink alcohol, or use illegal drugs. Some items may interact with your medicine. What should I watch for while using this medicine? Visit your doctor or health care professional for regular check ups. See your doctor if you or your partner has sexual contact with others, becomes HIV positive, or gets a sexual transmitted disease. This product does not protect you against HIV infection (AIDS) or other sexually transmitted diseases. You can check the placement of the IUD yourself by reaching up to the top of your vagina with clean fingers to feel the threads. Do not pull on the threads. It is a good habit to check placement after each menstrual period. Call your doctor right away if you feel more of the IUD than just the threads or if you cannot feel the threads at all. The IUD may come out by itself. You may become pregnant if  the device comes out. If you notice that the IUD has come out use a backup birth control method like condoms and call your health care provider. Using tampons will not change the position of the IUD and are okay to use during your period. This IUD can be safely scanned with magnetic resonance imaging (MRI) only under specific conditions. Before you have an MRI, tell your healthcare provider that you have an IUD in place, and which type of IUD you have in place. What side effects may I notice from receiving this medicine? Side effects that you should report to your doctor or health care professional as soon as possible:  allergic reactions like skin rash, itching or hives, swelling of the face, lips, or tongue  fever, flu-like symptoms  genital sores  high blood  pressure  no menstrual period for 6 weeks during use  pain, swelling, warmth in the leg  pelvic pain or tenderness  severe or sudden headache  signs of pregnancy  stomach cramping  sudden shortness of breath  trouble with balance, talking, or walking  unusual vaginal bleeding, discharge  yellowing of the eyes or skin Side effects that usually do not require medical attention (report to your doctor or health care professional if they continue or are bothersome):  acne  breast pain  change in sex drive or performance  changes in weight  cramping, dizziness, or faintness while the device is being inserted  headache  irregular menstrual bleeding within first 3 to 6 months of use  nausea This list may not describe all possible side effects. Call your doctor for medical advice about side effects. You may report side effects to FDA at 1-800-FDA-1088. Where should I keep my medicine? This does not apply. NOTE: This sheet is a summary. It may not cover all possible information. If you have questions about this medicine, talk to your doctor, pharmacist, or health care provider.  2020 Elsevier/Gold Standard (2018-07-14 13:22:01)   Post Anesthesia Home Care Instructions  Activity: Get plenty of rest for the remainder of the day. A responsible individual must stay with you for 24 hours following the procedure.  For the next 24 hours, DO NOT: -Drive a car -Paediatric nurse -Drink alcoholic beverages -Take any medication unless instructed by your physician -Make any legal decisions or sign important papers.  Meals: Start with liquid foods such as gelatin or soup. Progress to regular foods as tolerated. Avoid greasy, spicy, heavy foods. If nausea and/or vomiting occur, drink only clear liquids until the nausea and/or vomiting subsides. Call your physician if vomiting continues.  Special Instructions/Symptoms: Your throat may feel dry or sore from the anesthesia or the  breathing tube placed in your throat during surgery. If this causes discomfort, gargle with warm salt water. The discomfort should disappear within 24 hours.  If you had a scopolamine patch placed behind your ear for the management of post- operative nausea and/or vomiting:  1. The medication in the patch is effective for 72 hours, after which it should be removed.  Wrap patch in a tissue and discard in the trash. Wash hands thoroughly with soap and water. 2. You may remove the patch earlier than 72 hours if you experience unpleasant side effects which may include dry mouth, dizziness or visual disturbances. 3. Avoid touching the patch. Wash your hands with soap and water after contact with the patch.     Remove patch behind right ear by Thursday, August 10, 2020.  May take Tylenol after 1200  noon.  May take Advil, Motrin, or Ibuprofen after 2 PM.

## 2020-08-07 NOTE — Anesthesia Postprocedure Evaluation (Signed)
Anesthesia Post Note  Patient: Dawnmarie Breon  Procedure(s) Performed: DILATATION AND CURETTAGE OF THE UTERUS and removal of IUD (N/A Uterus) INTRAUTERINE DEVICE (IUD) INSERTION (N/A Uterus)     Patient location during evaluation: PACU Anesthesia Type: General Level of consciousness: awake and alert Pain management: pain level controlled Vital Signs Assessment: post-procedure vital signs reviewed and stable Respiratory status: spontaneous breathing, nonlabored ventilation, respiratory function stable and patient connected to nasal cannula oxygen Cardiovascular status: blood pressure returned to baseline and stable Postop Assessment: no apparent nausea or vomiting Anesthetic complications: no   No complications documented.  Last Vitals:  Vitals:   08/07/20 1015 08/07/20 1030  BP: 121/88 120/86  Pulse: 84 82  Resp: 18 19  Temp:  36.6 C  SpO2: 95% 95%    Last Pain:  Vitals:   08/07/20 1030  TempSrc:   PainSc: 4                  Naidelyn Parrella L Ameya Kutz

## 2020-08-07 NOTE — Transfer of Care (Signed)
Immediate Anesthesia Transfer of Care Note  Patient: Sherry [name]  Procedure(s) Performed: DILATATION AND CURETTAGE OF THE UTERUS (N/A Uterus) INTRAUTERINE DEVICE (IUD) INSERTION (N/A Uterus)  Patient Location: PACU  Anesthesia Type:General  Level of Consciousness: drowsy and patient cooperative  Airway & Oxygen Therapy: Patient Spontanous Breathing and Patient connected to face mask oxygen  Post-op Assessment: Report given to RN and Post -op Vital signs reviewed and stable  Post vital signs: Reviewed and stable  Last Vitals:  Vitals Value Taken Time  BP 118/86 08/07/20 0958  Temp    Pulse 81 08/07/20 0959  Resp 20 08/07/20 0959  SpO2 100 % 08/07/20 0959  Vitals shown include unvalidated device data.  Last Pain:  Vitals:   08/07/20 0814  TempSrc: Oral  PainSc: 0-No pain      Patients Stated Pain Goal: 7 (09/09/82 4621)  Complications: No complications documented.

## 2020-08-07 NOTE — Anesthesia Procedure Notes (Addendum)
Procedure Name: LMA Insertion Date/Time: 08/07/2020 9:27 AM Performed by: Montel Clock, CRNA Pre-anesthesia Checklist: Patient identified, Emergency Drugs available, Suction available, Patient being monitored and Timeout performed Patient Re-evaluated:Patient Re-evaluated prior to induction Oxygen Delivery Method: Circle system utilized Preoxygenation: Pre-oxygenation with 100% oxygen Induction Type: IV induction Ventilation: Mask ventilation without difficulty LMA: LMA inserted LMA Size: 4.0 Number of attempts: 1 Dental Injury: Teeth and Oropharynx as per pre-operative assessment

## 2020-08-07 NOTE — Interval H&P Note (Signed)
History and Physical Interval Note:  08/07/2020 7:53 AM  Sherry [name]  has presented today for surgery, with the diagnosis of COMPLEX ATYPICAL ENDOMETRIAL HYPERPLASIA.  The various methods of treatment have been discussed with the patient and family. After consideration of risks, benefits and other options for treatment, the patient has consented to  Procedure(s) with comments: DILATATION AND CURETTAGE OF THE UTERUS (N/A) INTRAUTERINE DEVICE (IUD) INSERTION (N/A) - IUD FROM INPATIENT PHARMACY as a surgical intervention.  The patient's history has been reviewed, patient examined, no change in status, stable for surgery.  I have reviewed the patient's chart and labs.  Questions were answered to the patient's satisfaction.     Thereasa Solo

## 2020-08-07 NOTE — Op Note (Signed)
OPERATIVE NOTE  PATIENT: Sherry [name] DATE: 08/07/20   Preop Diagnosis: endometrial cancer treated medically with progestin releasing IUD  Postoperative Diagnosis: same  Surgery: D&C (dilation and curettage), removal of IUD, replacement of IUD.   Surgeons:  Donaciano Eva, MD Assistant: none  Anesthesia: General   Estimated blood loss: <84ml  IVF:  235ml   Urine output: 50 ml   Complications: None   Pathology: endometrial curetteings  Operative findings: normal appearing cervix with IUD strings from os. Uterus sounded to 8cm.   Procedure: The patient was identified in the preoperative holding area. Informed consent was signed on the chart. Patient was seen history was reviewed and exam was performed.   The patient was then taken to the operating room and placed in the supine position with SCD hose on. General anesthesia was then induced without difficulty. She was then placed in the dorsolithotomy position. The perineum was prepped with Betadine. The vagina was prepped with Betadine. The patient was then draped after the prep was dried. A red rubber catheter was used to empty the bladder was performed under aseptic conditions.  Timeout was performed the patient, procedure, antibiotic, allergy, and length of procedure.   The weighted speculum was placed in the posterior vagina. The single tooth tenaculum was placed on the anterior lip of the cervix. The paracervical block was made with 10cc of 1% lidocaine. The uterine sound was placed in the cervix and advanced to the fundus. The cervix was successively dilated using hagar dilators. A sharp curette was advanced to the fundus and a comprehensive curette of the endometrial cavity took place until a gritty feel was appreciated. The specimen was collected on a telfa and sent for permanent pathology.  The Mirena IUD ex Feb 2024; Lot [id number]  The tenaculum was removed and hemostasis was observed.   The vagina was  irrigated.  All instrument, suture, laparotomy, Ray-Tec, and needle counts were correct x2. The patient tolerated the procedure well and was taken recovery room in stable condition. This is Everitt Amber dictating an operative note on [facility].

## 2020-08-08 ENCOUNTER — Telehealth: Payer: Self-pay

## 2020-08-08 ENCOUNTER — Encounter (HOSPITAL_BASED_OUTPATIENT_CLINIC_OR_DEPARTMENT_OTHER): Payer: Self-pay | Admitting: Gynecologic Oncology

## 2020-08-08 NOTE — Telephone Encounter (Signed)
Sherry [name] states that she is eating,drinking, and urinating well. Has not passed gas. She will begin senokot-S  1 tab bid with 8 oz of water. Will increase to 2 tabs bid if no good BM by midday tomorrow. Afebrile. Follow up appointment pending final pathology from surgery. Pain controlled with ibuprofen and heating pad. Pt aware  the office number [phone] and after hours number [phone] to call if she has any questions or concerns

## 2020-08-09 LAB — SURGICAL PATHOLOGY

## 2020-08-16 ENCOUNTER — Telehealth: Payer: Self-pay

## 2020-08-16 NOTE — Telephone Encounter (Addendum)
Told Sherry [name] that the final pathology report showed no pre cancer or cancer seen per [name],NP.

## 2020-08-16 NOTE — Telephone Encounter (Signed)
Told Sherry [name] that Dr. Denman George said that she does not need to come back to see her.  Sherry Vanderhoef can f/u with her regular OB GYN and Dr. Kerin Perna.  Sherry Sedam stated that Dr. Kerin Perna had a few questions for Dr. Denman George. Sherry Deatley will send a MyChart message with the questions to Dr. Denman George.

## 2021-01-01 ENCOUNTER — Telehealth: Payer: Self-pay | Admitting: *Deleted

## 2021-01-01 ENCOUNTER — Encounter: Payer: Self-pay | Admitting: Gynecologic Oncology

## 2021-01-01 NOTE — Telephone Encounter (Signed)
Patient called and left a message stating "I am a patient of Dr Denman George, I had a procedure done in November of last year. All samples came back clear. I wanted to know when I can start fertility and have the IUD removed."

## 2021-01-02 NOTE — Telephone Encounter (Signed)
Told Sherry [name] that Dr. Denman George is out of the office and will return on April 26,2022. Will have her review the My Chart message and contact her with Dr. Serita Grit recommendations.  Pt verbalized understanding.

## 2021-11-05 ENCOUNTER — Emergency Department (INDEPENDENT_AMBULATORY_CARE_PROVIDER_SITE_OTHER)
Admission: RE | Admit: 2021-11-05 | Discharge: 2021-11-05 | Disposition: A | Discharge status: Home / Self Care | Payer: BC Managed Care – PPO | Source: Ambulatory Visit

## 2021-11-05 ENCOUNTER — Emergency Department (INDEPENDENT_AMBULATORY_CARE_PROVIDER_SITE_OTHER): Payer: BC Managed Care – PPO

## 2021-11-05 ENCOUNTER — Other Ambulatory Visit: Payer: Self-pay

## 2021-11-05 VITALS — BP 123/77 | HR 85 | Temp 98.3°F | Resp 20 | Ht 62.0 in | Wt 175.0 lb

## 2021-11-05 DIAGNOSIS — S46911A Strain of unspecified muscle, fascia and tendon at shoulder and upper arm level, right arm, initial encounter: Secondary | ICD-10-CM | POA: Diagnosis not present

## 2021-11-05 DIAGNOSIS — S40011A Contusion of right shoulder, initial encounter: Secondary | ICD-10-CM | POA: Diagnosis not present

## 2021-11-05 DIAGNOSIS — M25511 Pain in right shoulder: Secondary | ICD-10-CM

## 2021-11-05 MED ORDER — PREDNISONE 20 MG PO TABS: ORAL_TABLET | ORAL | 0 refills | Status: AC

## 2021-11-05 MED ORDER — BACLOFEN 10 MG PO TABS: 10.0000 mg | ORAL_TABLET | Freq: Three times a day (TID) | ORAL | 0 refills | Status: AC

## 2021-11-05 MED ORDER — CELECOXIB 100 MG PO CAPS: 100.0000 mg | ORAL_CAPSULE | Freq: Two times a day (BID) | ORAL | 0 refills | Status: AC

## 2021-11-05 NOTE — ED Provider Notes (Signed)
[name]    CSN: [id number] Arrival date & time: 11/05/21  0945      History   Chief Complaint Chief Complaint  Patient presents with   Shoulder Pain    Right shoulder pain x1 week    HPI Sherry [name] is a 36 y.o. female.   HPI 36 year old female presents with right shoulder pain for 1 week.  Reports right shoulder pain is secondary to injury when falling 1 week ago.  PMH significant for endometrial cancer and headache.  Past Medical History:  Diagnosis Date   Asthma    COVID 11/2019   fever sob, in hospital x 2 days all symptoms resolved in 10 days   Dysrhythmia    tachycardia   Endometrial cancer ([place]) 2016   Gluten-sensitive enteropathy    Headache    Migraines   IBS (irritable bowel syndrome)    PCOS (polycystic ovarian syndrome)    Polycystic ovaries    PONV (postoperative nausea and vomiting) once yrs ago   Seasonal allergies    Strep throat 2017    Patient Active Problem List   Diagnosis Date Noted   Endometrial cancer ([place]) 01/19/2016    Past Surgical History:  Procedure Laterality Date   DILATATION & CURETTAGE/HYSTEROSCOPY WITH MYOSURE N/A 08/14/2015   Procedure: DILATATION & CURETTAGE/HYSTEROSCOPY WITH MYOSURE/Polypectomy;  Surgeon: Aloha Gell, MD;  Location: [facility];  Service: Gynecology;  Laterality: N/A;   DILATION AND CURETTAGE OF UTERUS N/A 08/07/2020   Procedure: DILATATION AND CURETTAGE OF THE UTERUS and removal of IUD;  Surgeon: Everitt Amber, MD;  Location: [facility];  Service: Gynecology;  Laterality: N/A;   INTRAUTERINE DEVICE (IUD) INSERTION N/A 08/07/2020   Procedure: INTRAUTERINE DEVICE (IUD) INSERTION;  Surgeon: Everitt Amber, MD;  Location: [facility];  Service: Gynecology;  Laterality: N/A;  IUD FROM INPATIENT PHARMACY   WISDOM TOOTH EXTRACTION  age 31    OB History   No obstetric history on file.      Home Medications    Prior to Admission medications   Medication Sig Start  Date End Date Taking? Authorizing Provider  Albuterol Sulfate, sensor, (PROAIR DIGIHALER) 108 (90 Base) MCG/ACT AEPB Inhale into the lungs as needed.    Yes [provider]  aspirin EC 81 MG tablet Take 81 mg by mouth daily. Swallow whole.   Yes [provider]  baclofen (LIORESAL) 10 MG tablet Take 1 tablet (10 mg total) by mouth 3 (three) times daily. 11/05/21  Yes Eliezer Lofts, FNP  cabergoline (DOSTINEX) 0.5 MG tablet Take 0.5 mg by mouth daily. 10/11/21  Yes [provider]  celecoxib (CELEBREX) 100 MG capsule Take 1 capsule (100 mg total) by mouth 2 (two) times daily for 15 days. 11/05/21 11/20/21 Yes Eliezer Lofts, FNP  DOTTI 0.1 MG/24HR patch SMARTSIG:1 Patch(s) T-DERMAL Every 3 Days 11/02/21  Yes [provider]  loratadine (CLARITIN) 10 MG tablet Take 10 mg by mouth daily. Reported on 10/23/2015   Yes [provider]  predniSONE (DELTASONE) 20 MG tablet Take 3 tabs PO daily x 5 days. 11/05/21  Yes Eliezer Lofts, FNP  QSYMIA 7.5-46 MG CP24 Take 1 capsule by mouth daily. 05/31/20  Yes [provider]  Cholecalciferol (VITAMIN D3) 50 MCG (2000 UT) CAPS Take 1 capsule by mouth daily.    [provider]  cyanocobalamin 100 MCG tablet Take by mouth.    [provider]  valACYclovir (VALTREX) 500 MG tablet Take 500 mg by mouth 2 (  two) times daily. Reported on 01/19/2016 Patient not taking: Reported on 07/21/2020 10/05/15   [provider]    Family History Family History  Problem Relation Age of Onset   Hypertension Father    Diabetes Paternal Aunt    Cancer Paternal Grandmother        Non-Hogkins Lymphoma   Heart disease Paternal Grandfather    Heart attack Paternal Grandfather     Social History Social History   Tobacco Use   Smoking status: Never   Smokeless tobacco: Never  Vaping Use   Vaping Use: Never used  Substance Use Topics   Alcohol use: Yes    Comment: occ   Drug use: No     Allergies    Gluten meal and Cranberry   Review of Systems Review of Systems  Musculoskeletal:        Right shoulder pain x1 week  All other systems reviewed and are negative.   Physical Exam Triage Vital Signs ED Triage Vitals  Enc Vitals Group     BP 11/05/21 1026 123/77     Pulse Rate 11/05/21 1026 85     Resp 11/05/21 1026 20     Temp 11/05/21 1026 98.3 F (36.8 C)     Temp Source 11/05/21 1026 Oral     SpO2 11/05/21 1026 97 %     Weight 11/05/21 1021 175 lb (79.4 kg)     Height 11/05/21 1021 5\' 2"  (1.575 m)     Head Circumference --      Peak Flow --      Pain Score 11/05/21 1021 2     Pain Loc --      Pain Edu? --      Excl. in [place]? --    No data found.  Updated Vital Signs BP 123/77 (BP Location: Left Arm)    Pulse 85    Temp 98.3 F (36.8 C) (Oral)    Resp 20    Ht 5\' 2"  (1.575 m)    Wt 175 lb (79.4 kg)    LMP 10/19/2021    SpO2 97%    BMI 32.01 kg/m    Physical Exam Vitals and nursing note reviewed.  Constitutional:      General: She is not in acute distress.    Appearance: Normal appearance. She is obese. She is not ill-appearing.  HENT:     Head: Normocephalic and atraumatic.     Mouth/Throat:     Mouth: Mucous membranes are moist.     Pharynx: Oropharynx is clear.  Eyes:     Extraocular Movements: Extraocular movements intact.     Conjunctiva/sclera: Conjunctivae normal.     Pupils: Pupils are equal, round, and reactive to light.  Cardiovascular:     Rate and Rhythm: Normal rate and regular rhythm.     Pulses: Normal pulses.     Heart sounds: Normal heart sounds.  Pulmonary:     Effort: Pulmonary effort is normal.     Breath sounds: Normal breath sounds.  Musculoskeletal:     Cervical back: Normal range of motion and neck supple.     Comments: Right shoulder: TTP over medial aspect including GH joint, limited range of motion with forward flexion/extension, horizontal abduction, and internal rotation.  No deformity noted.  Skin:    General: Skin is warm  and dry.  Neurological:     General: No focal deficit present.     Mental Status: She is alert and oriented to person, place, and  time.     UC Treatments / Results  Labs (all labs ordered are listed, but only abnormal results are displayed) Labs Reviewed - No data to display  EKG   Radiology DG Shoulder Right  Result Date: 11/05/2021 CLINICAL DATA:  Right shoulder accident, skiing accident EXAM: RIGHT SHOULDER - 2+ VIEW COMPARISON:  None. FINDINGS: There is no evidence of fracture or dislocation. There is no evidence of arthropathy or other focal bone abnormality. Soft tissues are unremarkable. IMPRESSION: Negative. Electronically Signed   By: Jerilynn Mages.  Shick M.D.   On: 11/05/2021 10:40    Procedures Procedures (including critical care time)  Medications Ordered in UC Medications - No data to display  Initial Impression / Assessment and Plan / UC Course  I have reviewed the triage vital signs and the nursing notes.  Pertinent labs & imaging results that were available during my care of the patient were reviewed by me and considered in my medical decision making (see chart for details).     MDM: 1.  Strain of right shoulder, initial encounter-Rx'd Prednisone and Baclofen; 2.  Contusion of right shoulder, initial encounter-Rx'd Celebrex. Advised patient to take medication as directed with food to completion.  Advised patient may take baclofen daily or as needed for accompanying muscle spasms.  Encouraged patient to increase daily water intake while taking these medications.  Advised patient to avoid moderate to strenuous activities involving affected area of right shoulder for the next 7 to 10 days.  Advised patient if symptoms worsen and/or unresolved please follow-up with PCP or here for further evaluation.  Patient discharged home, hemodynamically stable. Final Clinical Impressions(s) / UC Diagnoses   Final diagnoses:  Strain of right shoulder, initial encounter  Contusion of right  shoulder, initial encounter     Discharge Instructions      Advised patient to take medication as directed with food to completion.  Advised patient may take baclofen daily or as needed for accompanying muscle spasms.  Encouraged patient to increase daily water intake while taking these medications.  Advised patient to avoid moderate to strenuous activities involving affected area of right shoulder for the next 7 to 10 days.  Advised patient if symptoms worsen and/or unresolved please follow-up with PCP or here for further evaluation.     ED Prescriptions     Medication Sig Dispense Auth. Provider   predniSONE (DELTASONE) 20 MG tablet Take 3 tabs PO daily x 5 days. 15 tablet Eliezer Lofts, FNP   celecoxib (CELEBREX) 100 MG capsule Take 1 capsule (100 mg total) by mouth 2 (two) times daily for 15 days. 30 capsule Eliezer Lofts, FNP   baclofen (LIORESAL) 10 MG tablet Take 1 tablet (10 mg total) by mouth 3 (three) times daily. 76 each Eliezer Lofts, FNP      PDMP not reviewed this encounter.   Eliezer Lofts, [place] 11/05/21 1149

## 2021-11-05 NOTE — Discharge Instructions (Addendum)
Advised patient to take medication as directed with food to completion.  Advised patient may take baclofen daily or as needed for accompanying muscle spasms.  Encouraged patient to increase daily water intake while taking these medications.  Advised patient to avoid moderate to strenuous activities involving affected area of right shoulder for the next 7 to 10 days.  Advised patient if symptoms worsen and/or unresolved please follow-up with PCP or here for further evaluation.

## 2021-11-05 NOTE — ED Triage Notes (Signed)
Pt states that she fell and injured her right shoulder. X1 week

## 2022-09-08 IMAGING — DX DG SHOULDER 2+V*R*
3 series · 3 of 3 positions shown · non-contrast
Comparison: None.

CLINICAL DATA: Right shoulder accident, skiing accident

EXAM:
RIGHT SHOULDER - 2+ VIEW

[shoulder grashey]
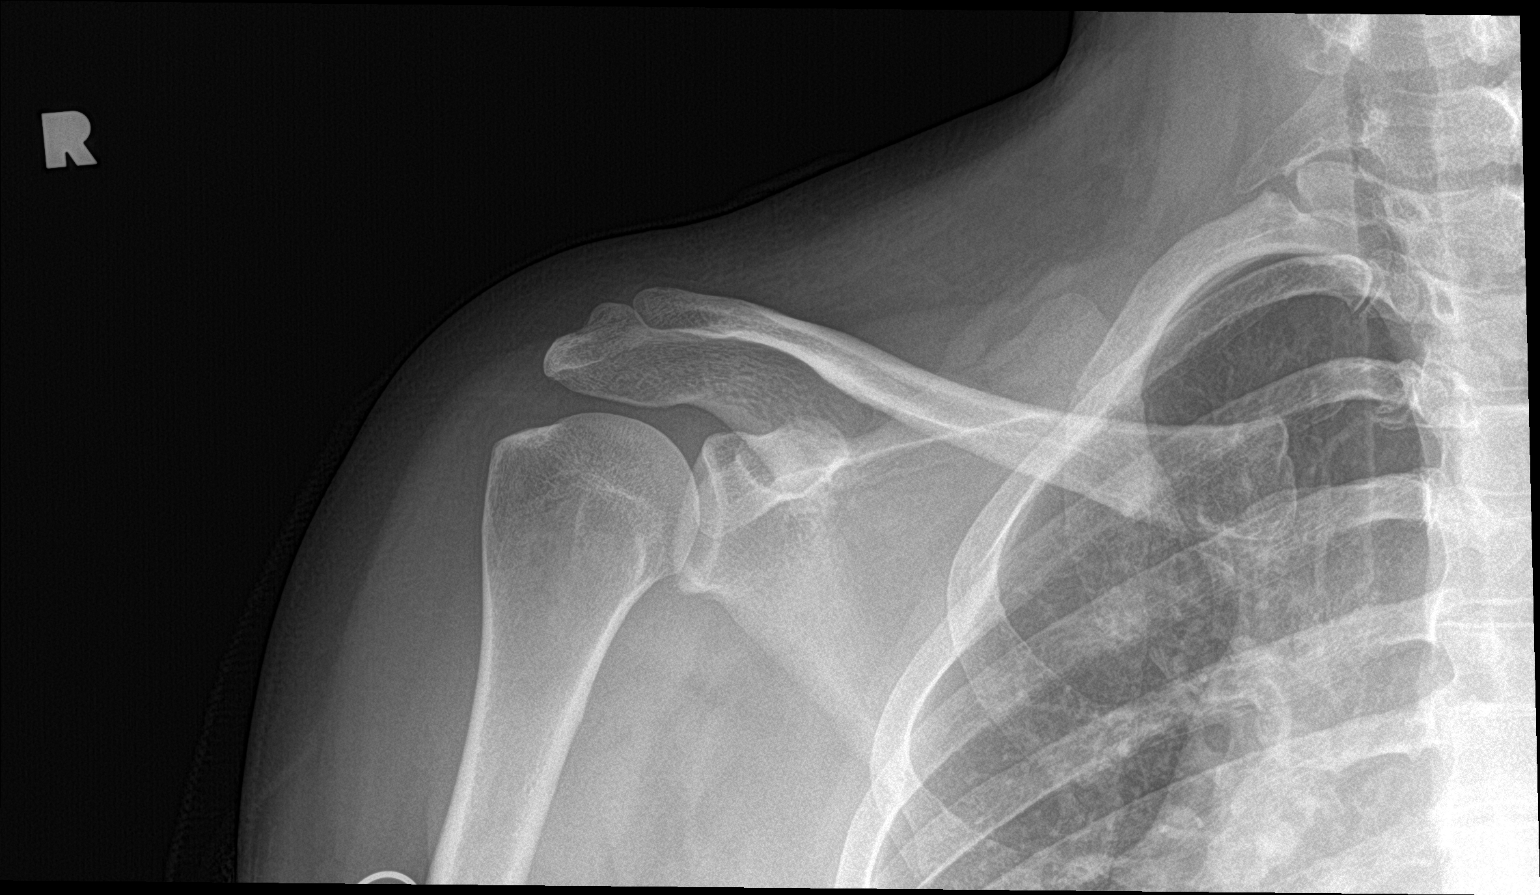

[shoulder y view]
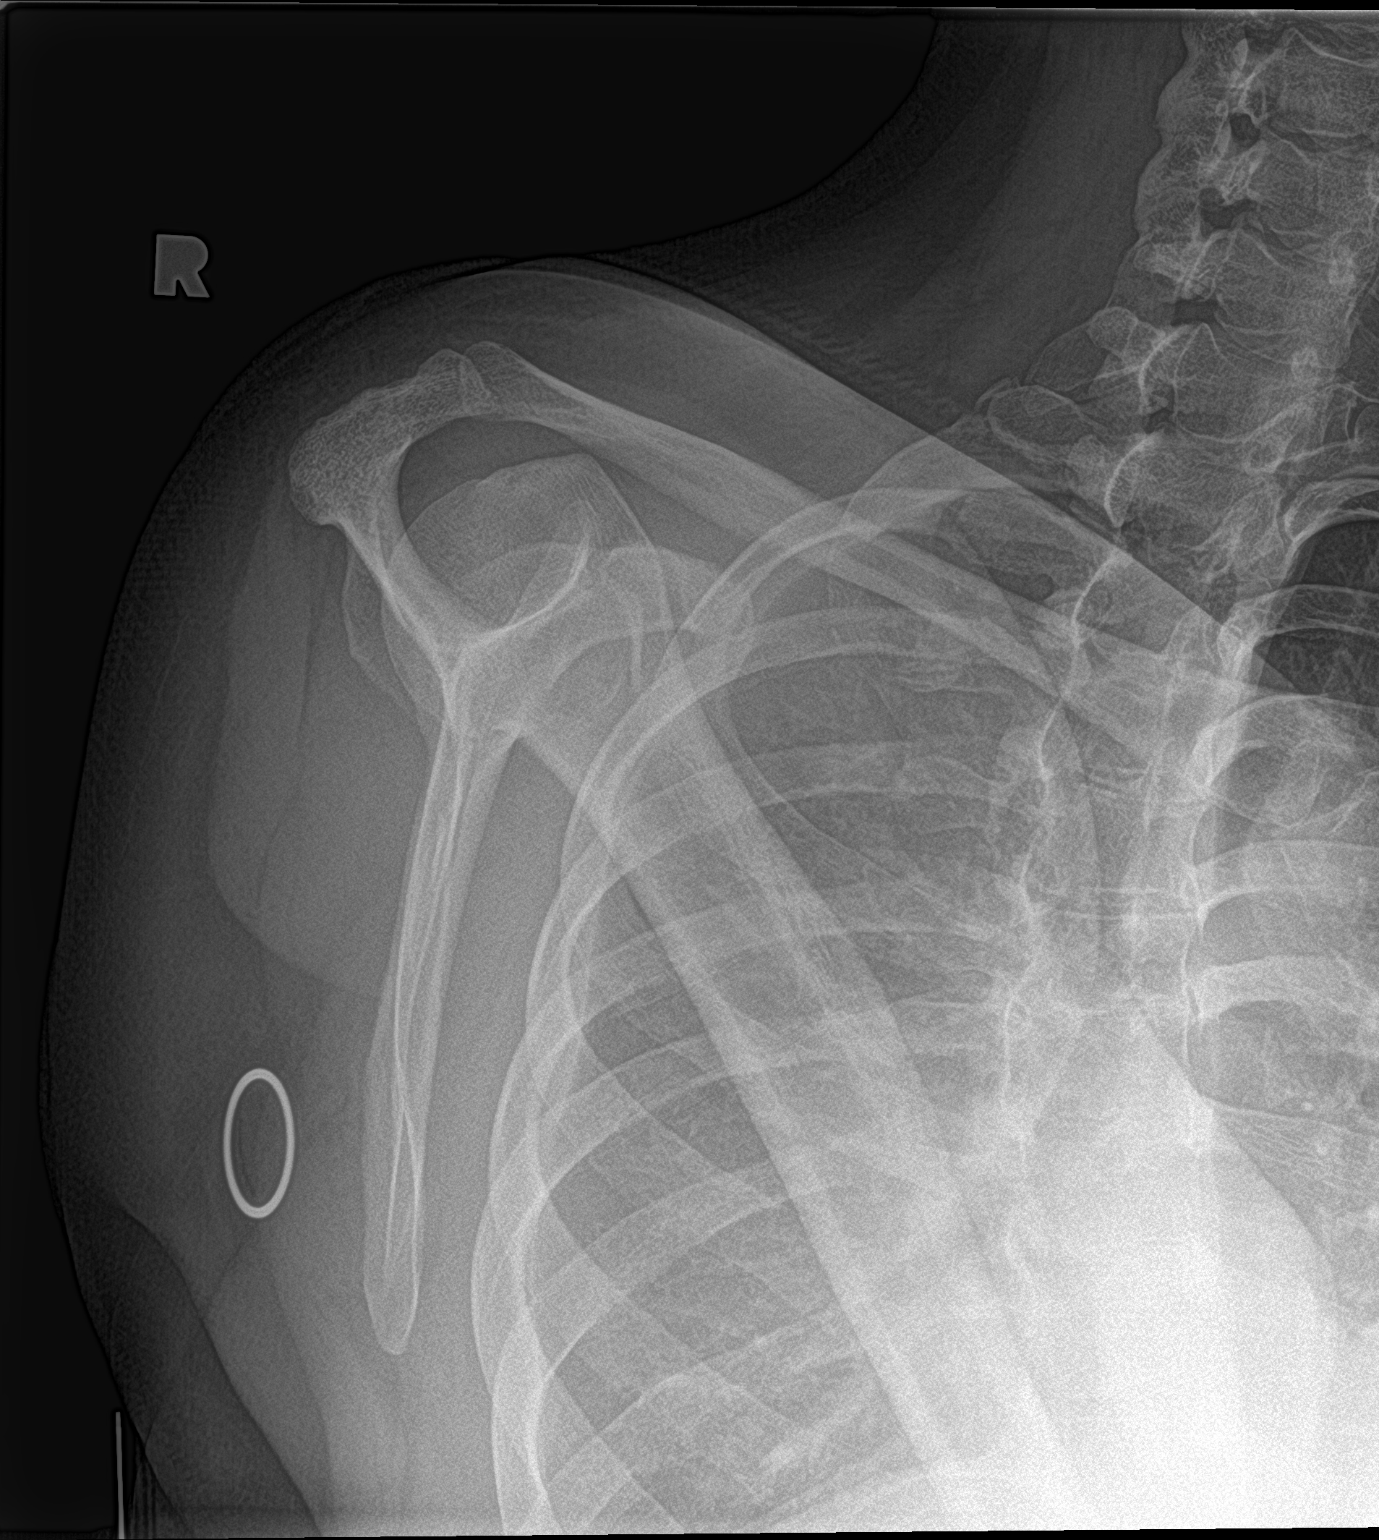

[shoulder axillary]
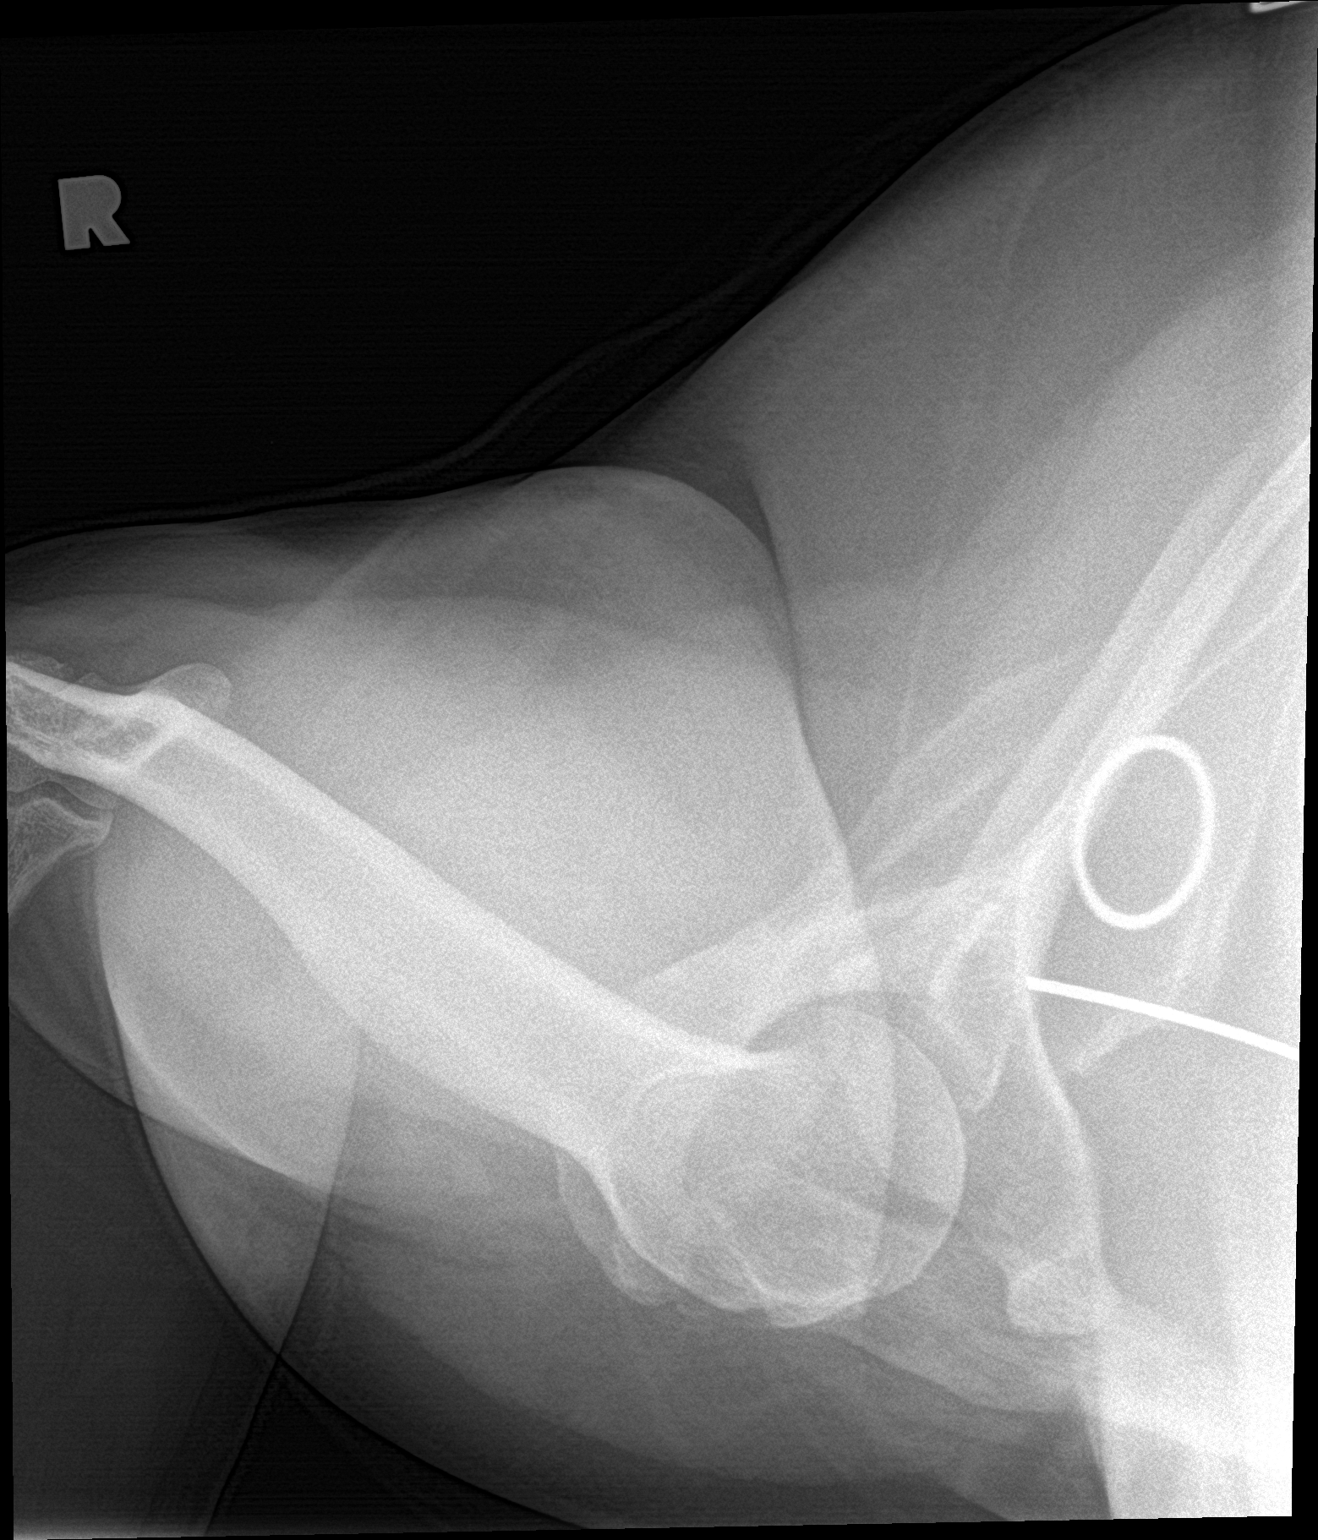

[3 of 3 positions shown; findings below may reference images not displayed]

FINDINGS: There is no evidence of fracture or dislocation. There is no
evidence of arthropathy or other focal bone abnormality. Soft
tissues are unremarkable.
IMPRESSION: Negative.

## 2022-09-17 DIAGNOSIS — O09513 Supervision of elderly primigravida, third trimester: Secondary | ICD-10-CM | POA: Diagnosis not present

## 2022-09-17 DIAGNOSIS — O322XX0 Maternal care for transverse and oblique lie, not applicable or unspecified: Secondary | ICD-10-CM | POA: Diagnosis not present

## 2022-09-17 DIAGNOSIS — Z8542 Personal history of malignant neoplasm of other parts of uterus: Secondary | ICD-10-CM | POA: Diagnosis not present

## 2022-09-17 DIAGNOSIS — E669 Obesity, unspecified: Secondary | ICD-10-CM | POA: Diagnosis not present

## 2022-09-17 DIAGNOSIS — O99213 Obesity complicating pregnancy, third trimester: Secondary | ICD-10-CM | POA: Diagnosis not present

## 2022-09-17 DIAGNOSIS — O09523 Supervision of elderly multigravida, third trimester: Secondary | ICD-10-CM | POA: Diagnosis not present

## 2022-09-17 DIAGNOSIS — Z3A32 32 weeks gestation of pregnancy: Secondary | ICD-10-CM | POA: Diagnosis not present

## 2022-09-23 DIAGNOSIS — O24410 Gestational diabetes mellitus in pregnancy, diet controlled: Secondary | ICD-10-CM | POA: Diagnosis not present

## 2022-09-23 DIAGNOSIS — Z8542 Personal history of malignant neoplasm of other parts of uterus: Secondary | ICD-10-CM | POA: Diagnosis not present

## 2022-09-23 DIAGNOSIS — O99213 Obesity complicating pregnancy, third trimester: Secondary | ICD-10-CM | POA: Diagnosis not present

## 2022-09-23 DIAGNOSIS — E669 Obesity, unspecified: Secondary | ICD-10-CM | POA: Diagnosis not present

## 2022-09-23 DIAGNOSIS — Z3A33 33 weeks gestation of pregnancy: Secondary | ICD-10-CM | POA: Diagnosis not present

## 2022-09-23 DIAGNOSIS — Z23 Encounter for immunization: Secondary | ICD-10-CM | POA: Diagnosis not present

## 2022-09-23 DIAGNOSIS — O09513 Supervision of elderly primigravida, third trimester: Secondary | ICD-10-CM | POA: Diagnosis not present

## 2022-10-14 DIAGNOSIS — O09513 Supervision of elderly primigravida, third trimester: Secondary | ICD-10-CM | POA: Diagnosis not present

## 2022-10-14 DIAGNOSIS — J45909 Unspecified asthma, uncomplicated: Secondary | ICD-10-CM | POA: Diagnosis not present

## 2022-10-14 DIAGNOSIS — O322XX0 Maternal care for transverse and oblique lie, not applicable or unspecified: Secondary | ICD-10-CM | POA: Diagnosis not present

## 2022-10-14 DIAGNOSIS — Z8542 Personal history of malignant neoplasm of other parts of uterus: Secondary | ICD-10-CM | POA: Diagnosis not present

## 2022-10-14 DIAGNOSIS — Z3A36 36 weeks gestation of pregnancy: Secondary | ICD-10-CM | POA: Diagnosis not present

## 2022-10-14 DIAGNOSIS — O99213 Obesity complicating pregnancy, third trimester: Secondary | ICD-10-CM | POA: Diagnosis not present

## 2022-10-14 DIAGNOSIS — O24410 Gestational diabetes mellitus in pregnancy, diet controlled: Secondary | ICD-10-CM | POA: Diagnosis not present

## 2022-10-14 DIAGNOSIS — O99513 Diseases of the respiratory system complicating pregnancy, third trimester: Secondary | ICD-10-CM | POA: Diagnosis not present

## 2022-10-24 DIAGNOSIS — O26893 Other specified pregnancy related conditions, third trimester: Secondary | ICD-10-CM | POA: Diagnosis not present

## 2022-10-24 DIAGNOSIS — J45909 Unspecified asthma, uncomplicated: Secondary | ICD-10-CM | POA: Diagnosis not present

## 2022-10-24 DIAGNOSIS — O99513 Diseases of the respiratory system complicating pregnancy, third trimester: Secondary | ICD-10-CM | POA: Diagnosis not present

## 2022-10-24 DIAGNOSIS — O99213 Obesity complicating pregnancy, third trimester: Secondary | ICD-10-CM | POA: Diagnosis not present

## 2022-10-24 DIAGNOSIS — Z8542 Personal history of malignant neoplasm of other parts of uterus: Secondary | ICD-10-CM | POA: Diagnosis not present

## 2022-10-24 DIAGNOSIS — O09523 Supervision of elderly multigravida, third trimester: Secondary | ICD-10-CM | POA: Diagnosis not present

## 2022-10-24 DIAGNOSIS — Z3A37 37 weeks gestation of pregnancy: Secondary | ICD-10-CM | POA: Diagnosis not present

## 2022-11-04 DIAGNOSIS — O98513 Other viral diseases complicating pregnancy, third trimester: Secondary | ICD-10-CM | POA: Diagnosis not present

## 2022-11-04 DIAGNOSIS — O321XX0 Maternal care for breech presentation, not applicable or unspecified: Secondary | ICD-10-CM | POA: Diagnosis not present

## 2022-11-04 DIAGNOSIS — O99213 Obesity complicating pregnancy, third trimester: Secondary | ICD-10-CM | POA: Diagnosis not present

## 2022-11-04 DIAGNOSIS — B009 Herpesviral infection, unspecified: Secondary | ICD-10-CM | POA: Diagnosis not present

## 2022-11-04 DIAGNOSIS — O99820 Streptococcus B carrier state complicating pregnancy: Secondary | ICD-10-CM | POA: Diagnosis not present

## 2022-11-04 DIAGNOSIS — Z3A39 39 weeks gestation of pregnancy: Secondary | ICD-10-CM | POA: Diagnosis not present

## 2022-11-04 DIAGNOSIS — O24410 Gestational diabetes mellitus in pregnancy, diet controlled: Secondary | ICD-10-CM | POA: Diagnosis not present

## 2022-11-13 DIAGNOSIS — O99214 Obesity complicating childbirth: Secondary | ICD-10-CM | POA: Diagnosis not present

## 2022-11-13 DIAGNOSIS — Z3A40 40 weeks gestation of pregnancy: Secondary | ICD-10-CM | POA: Diagnosis not present

## 2022-11-13 DIAGNOSIS — O411230 Chorioamnionitis, third trimester, not applicable or unspecified: Secondary | ICD-10-CM | POA: Diagnosis not present

## 2022-11-13 DIAGNOSIS — O9902 Anemia complicating childbirth: Secondary | ICD-10-CM | POA: Diagnosis not present

## 2022-11-13 DIAGNOSIS — O4210 Premature rupture of membranes, onset of labor more than 24 hours following rupture, unspecified weeks of gestation: Secondary | ICD-10-CM | POA: Diagnosis not present

## 2022-11-13 DIAGNOSIS — O24420 Gestational diabetes mellitus in childbirth, diet controlled: Secondary | ICD-10-CM | POA: Diagnosis not present

## 2022-11-13 DIAGNOSIS — O329XX0 Maternal care for malpresentation of fetus, unspecified, not applicable or unspecified: Secondary | ICD-10-CM | POA: Diagnosis not present

## 2022-11-13 DIAGNOSIS — O99824 Streptococcus B carrier state complicating childbirth: Secondary | ICD-10-CM | POA: Diagnosis not present

## 2022-11-13 DIAGNOSIS — O480 Post-term pregnancy: Secondary | ICD-10-CM | POA: Diagnosis not present

## 2022-11-13 DIAGNOSIS — O24419 Gestational diabetes mellitus in pregnancy, unspecified control: Secondary | ICD-10-CM | POA: Diagnosis not present

## 2022-11-13 DIAGNOSIS — D62 Acute posthemorrhagic anemia: Secondary | ICD-10-CM | POA: Diagnosis not present

## 2022-11-13 DIAGNOSIS — O321XX0 Maternal care for breech presentation, not applicable or unspecified: Secondary | ICD-10-CM | POA: Diagnosis not present

## 2022-11-13 DIAGNOSIS — O42113 Preterm premature rupture of membranes, onset of labor more than 24 hours following rupture, third trimester: Secondary | ICD-10-CM | POA: Diagnosis not present

## 2022-11-13 DIAGNOSIS — O99213 Obesity complicating pregnancy, third trimester: Secondary | ICD-10-CM | POA: Diagnosis not present

## 2022-11-25 DIAGNOSIS — Z98891 History of uterine scar from previous surgery: Secondary | ICD-10-CM | POA: Diagnosis not present

## 2022-11-25 DIAGNOSIS — Z8542 Personal history of malignant neoplasm of other parts of uterus: Secondary | ICD-10-CM | POA: Diagnosis not present

## 2022-11-25 DIAGNOSIS — D62 Acute posthemorrhagic anemia: Secondary | ICD-10-CM | POA: Diagnosis not present

## 2022-12-04 DIAGNOSIS — Z8632 Personal history of gestational diabetes: Secondary | ICD-10-CM | POA: Diagnosis not present

## 2022-12-04 DIAGNOSIS — Z8542 Personal history of malignant neoplasm of other parts of uterus: Secondary | ICD-10-CM | POA: Diagnosis not present

## 2022-12-04 DIAGNOSIS — D62 Acute posthemorrhagic anemia: Secondary | ICD-10-CM | POA: Diagnosis not present

## 2022-12-04 DIAGNOSIS — B009 Herpesviral infection, unspecified: Secondary | ICD-10-CM | POA: Diagnosis not present

## 2022-12-25 DIAGNOSIS — F4322 Adjustment disorder with anxiety: Secondary | ICD-10-CM | POA: Diagnosis not present

## 2022-12-27 DIAGNOSIS — O24410 Gestational diabetes mellitus in pregnancy, diet controlled: Secondary | ICD-10-CM | POA: Diagnosis not present

## 2022-12-27 DIAGNOSIS — Z30014 Encounter for initial prescription of intrauterine contraceptive device: Secondary | ICD-10-CM | POA: Diagnosis not present

## 2023-01-22 DIAGNOSIS — F4322 Adjustment disorder with anxiety: Secondary | ICD-10-CM | POA: Diagnosis not present

## 2023-02-26 DIAGNOSIS — Z30431 Encounter for routine checking of intrauterine contraceptive device: Secondary | ICD-10-CM | POA: Diagnosis not present

## 2023-03-04 DIAGNOSIS — L918 Other hypertrophic disorders of the skin: Secondary | ICD-10-CM | POA: Diagnosis not present

## 2023-03-04 DIAGNOSIS — L820 Inflamed seborrheic keratosis: Secondary | ICD-10-CM | POA: Diagnosis not present

## 2023-03-24 DIAGNOSIS — L820 Inflamed seborrheic keratosis: Secondary | ICD-10-CM | POA: Diagnosis not present

## 2023-05-14 DIAGNOSIS — Z0000 Encounter for general adult medical examination without abnormal findings: Secondary | ICD-10-CM | POA: Diagnosis not present

## 2023-05-30 DIAGNOSIS — Z9109 Other allergy status, other than to drugs and biological substances: Secondary | ICD-10-CM | POA: Diagnosis not present

## 2023-05-30 DIAGNOSIS — Z8542 Personal history of malignant neoplasm of other parts of uterus: Secondary | ICD-10-CM | POA: Diagnosis not present

## 2023-05-30 DIAGNOSIS — F4322 Adjustment disorder with anxiety: Secondary | ICD-10-CM | POA: Diagnosis not present

## 2023-05-30 DIAGNOSIS — E782 Mixed hyperlipidemia: Secondary | ICD-10-CM | POA: Diagnosis not present

## 2023-05-30 DIAGNOSIS — J4521 Mild intermittent asthma with (acute) exacerbation: Secondary | ICD-10-CM | POA: Diagnosis not present

## 2023-05-30 DIAGNOSIS — Z0000 Encounter for general adult medical examination without abnormal findings: Secondary | ICD-10-CM | POA: Diagnosis not present

## 2023-10-07 DIAGNOSIS — R400 Somnolence: Secondary | ICD-10-CM | POA: Diagnosis not present

## 2023-10-07 DIAGNOSIS — R0683 Snoring: Secondary | ICD-10-CM | POA: Diagnosis not present

## 2023-10-07 DIAGNOSIS — M79605 Pain in left leg: Secondary | ICD-10-CM | POA: Diagnosis not present

## 2023-10-07 DIAGNOSIS — Z0000 Encounter for general adult medical examination without abnormal findings: Secondary | ICD-10-CM | POA: Diagnosis not present

## 2023-10-07 DIAGNOSIS — Z8632 Personal history of gestational diabetes: Secondary | ICD-10-CM | POA: Diagnosis not present

## 2023-10-07 DIAGNOSIS — E669 Obesity, unspecified: Secondary | ICD-10-CM | POA: Diagnosis not present

## 2023-10-07 DIAGNOSIS — M79604 Pain in right leg: Secondary | ICD-10-CM | POA: Diagnosis not present

## 2023-11-03 DIAGNOSIS — G2581 Restless legs syndrome: Secondary | ICD-10-CM | POA: Diagnosis not present

## 2023-11-03 DIAGNOSIS — O163 Unspecified maternal hypertension, third trimester: Secondary | ICD-10-CM | POA: Diagnosis not present

## 2023-11-03 DIAGNOSIS — Z9189 Other specified personal risk factors, not elsewhere classified: Secondary | ICD-10-CM | POA: Diagnosis not present

## 2023-11-03 DIAGNOSIS — E669 Obesity, unspecified: Secondary | ICD-10-CM | POA: Diagnosis not present

## 2023-11-07 DIAGNOSIS — E669 Obesity, unspecified: Secondary | ICD-10-CM | POA: Diagnosis not present

## 2023-11-24 DIAGNOSIS — G4733 Obstructive sleep apnea (adult) (pediatric): Secondary | ICD-10-CM | POA: Diagnosis not present

## 2023-11-27 DIAGNOSIS — G4733 Obstructive sleep apnea (adult) (pediatric): Secondary | ICD-10-CM | POA: Diagnosis not present

## 2023-12-05 DIAGNOSIS — E669 Obesity, unspecified: Secondary | ICD-10-CM | POA: Diagnosis not present

## 2023-12-17 DIAGNOSIS — J4521 Mild intermittent asthma with (acute) exacerbation: Secondary | ICD-10-CM | POA: Diagnosis not present

## 2023-12-17 DIAGNOSIS — E669 Obesity, unspecified: Secondary | ICD-10-CM | POA: Diagnosis not present

## 2024-01-05 DIAGNOSIS — E669 Obesity, unspecified: Secondary | ICD-10-CM | POA: Diagnosis not present

## 2024-02-04 DIAGNOSIS — E669 Obesity, unspecified: Secondary | ICD-10-CM | POA: Diagnosis not present

## 2024-03-01 DIAGNOSIS — Z01419 Encounter for gynecological examination (general) (routine) without abnormal findings: Secondary | ICD-10-CM | POA: Diagnosis not present

## 2024-03-06 DIAGNOSIS — E669 Obesity, unspecified: Secondary | ICD-10-CM | POA: Diagnosis not present

## 2024-03-08 DIAGNOSIS — E663 Overweight: Secondary | ICD-10-CM | POA: Diagnosis not present

## 2024-03-08 DIAGNOSIS — E785 Hyperlipidemia, unspecified: Secondary | ICD-10-CM | POA: Diagnosis not present

## 2024-03-08 DIAGNOSIS — Z8632 Personal history of gestational diabetes: Secondary | ICD-10-CM | POA: Diagnosis not present

## 2024-03-08 DIAGNOSIS — J4520 Mild intermittent asthma, uncomplicated: Secondary | ICD-10-CM | POA: Diagnosis not present

## 2024-03-08 DIAGNOSIS — Z789 Other specified health status: Secondary | ICD-10-CM | POA: Diagnosis not present

## 2024-04-05 DIAGNOSIS — E669 Obesity, unspecified: Secondary | ICD-10-CM | POA: Diagnosis not present

## 2024-04-07 DIAGNOSIS — R3989 Other symptoms and signs involving the genitourinary system: Secondary | ICD-10-CM | POA: Diagnosis not present

## 2024-04-15 DIAGNOSIS — Z789 Other specified health status: Secondary | ICD-10-CM | POA: Diagnosis not present

## 2024-04-15 DIAGNOSIS — Z8632 Personal history of gestational diabetes: Secondary | ICD-10-CM | POA: Diagnosis not present

## 2024-04-15 DIAGNOSIS — E785 Hyperlipidemia, unspecified: Secondary | ICD-10-CM | POA: Diagnosis not present

## 2024-05-06 DIAGNOSIS — E669 Obesity, unspecified: Secondary | ICD-10-CM | POA: Diagnosis not present

## 2024-05-13 DIAGNOSIS — L659 Nonscarring hair loss, unspecified: Secondary | ICD-10-CM | POA: Diagnosis not present

## 2024-05-13 DIAGNOSIS — M25511 Pain in right shoulder: Secondary | ICD-10-CM | POA: Diagnosis not present

## 2024-06-06 DIAGNOSIS — E669 Obesity, unspecified: Secondary | ICD-10-CM | POA: Diagnosis not present

## 2024-07-06 DIAGNOSIS — E669 Obesity, unspecified: Secondary | ICD-10-CM | POA: Diagnosis not present

## 2024-07-26 DIAGNOSIS — L659 Nonscarring hair loss, unspecified: Secondary | ICD-10-CM | POA: Diagnosis not present
